# Patient Record
Sex: Male | Born: 1940 | Race: White | Hispanic: No | Marital: Single | Smoking: Never smoker
Health system: Southern US, Community
[De-identification: ages and names within clinical notes are randomized; demographics above are authoritative.]

---

## 2004-07-07 ENCOUNTER — Ambulatory Visit: Payer: Self-pay

## 2007-03-06 ENCOUNTER — Emergency Department: Payer: Self-pay | Admitting: Internal Medicine

## 2007-03-06 ENCOUNTER — Other Ambulatory Visit: Payer: Self-pay

## 2007-03-18 ENCOUNTER — Ambulatory Visit: Payer: Self-pay | Admitting: Family Medicine

## 2007-10-15 ENCOUNTER — Ambulatory Visit: Payer: Self-pay | Admitting: Family Medicine

## 2008-04-01 ENCOUNTER — Ambulatory Visit: Payer: Self-pay

## 2008-06-28 ENCOUNTER — Ambulatory Visit: Payer: Self-pay | Admitting: Pain Medicine

## 2008-07-27 ENCOUNTER — Ambulatory Visit: Payer: Self-pay | Admitting: Pain Medicine

## 2008-08-12 ENCOUNTER — Ambulatory Visit: Payer: Self-pay | Admitting: Physician Assistant

## 2008-11-19 ENCOUNTER — Emergency Department: Payer: Self-pay | Admitting: Internal Medicine

## 2009-08-21 ENCOUNTER — Inpatient Hospital Stay: Payer: Self-pay | Admitting: Internal Medicine

## 2009-11-03 ENCOUNTER — Ambulatory Visit: Payer: Self-pay | Admitting: Emergency Medicine

## 2009-11-07 LAB — PATHOLOGY REPORT

## 2010-04-06 ENCOUNTER — Emergency Department: Payer: Self-pay | Admitting: Emergency Medicine

## 2010-07-02 ENCOUNTER — Emergency Department: Payer: Self-pay | Admitting: Emergency Medicine

## 2010-11-27 ENCOUNTER — Emergency Department (HOSPITAL_COMMUNITY)
Admission: EM | Admit: 2010-11-27 | Discharge: 2010-11-27 | Discharge status: Against Medical Advice | Payer: No Typology Code available for payment source

## 2010-11-27 NOTE — ED Notes (Signed)
Pt. Also has a rash on his both of his legs and back

## 2011-04-14 ENCOUNTER — Emergency Department: Payer: Self-pay | Admitting: Emergency Medicine

## 2011-04-14 LAB — URINALYSIS, COMPLETE
Bilirubin,UR: NEGATIVE
Blood: NEGATIVE
Glucose,UR: NEGATIVE mg/dL (ref 0–75)
Hyaline Cast: 4
Protein: NEGATIVE
RBC,UR: 1 /HPF (ref 0–5)
Specific Gravity: 1.012 (ref 1.003–1.030)
Squamous Epithelial: <1

## 2011-04-14 LAB — TROPONIN I: Troponin-I: 0.02 ng/mL

## 2011-04-14 LAB — CBC
HGB: 12 g/dL — ABNORMAL LOW (ref 13.0–18.0)
MCV: 97 fL (ref 80–100)
WBC: 3.8 10*3/uL (ref 3.8–10.6)

## 2011-04-14 LAB — COMPREHENSIVE METABOLIC PANEL
Albumin: 3 g/dL — ABNORMAL LOW (ref 3.4–5.0)
Alkaline Phosphatase: 69 U/L (ref 50–136)
BUN: 9 mg/dL (ref 7–18)
Bilirubin,Total: 0.9 mg/dL (ref 0.2–1.0)
EGFR (African American): >60
Glucose: 88 mg/dL (ref 65–99)
Potassium: 2.7 mmol/L — ABNORMAL LOW (ref 3.5–5.1)
SGOT(AST): 58 U/L — ABNORMAL HIGH (ref 15–37)
SGPT (ALT): 20 U/L
Sodium: 140 mmol/L (ref 136–145)

## 2011-04-14 LAB — MAGNESIUM: Magnesium: 1 mg/dL — ABNORMAL LOW

## 2011-08-20 ENCOUNTER — Emergency Department: Payer: Self-pay | Admitting: *Deleted

## 2011-08-20 LAB — ETHANOL
Ethanol %: 0.272 % — ABNORMAL HIGH (ref 0.000–0.080)
Ethanol: 272 mg/dL

## 2011-08-20 LAB — COMPREHENSIVE METABOLIC PANEL
Alkaline Phosphatase: 80 U/L (ref 50–136)
BUN: 7 mg/dL (ref 7–18)
Bilirubin,Total: 1 mg/dL (ref 0.2–1.0)
Chloride: 108 mmol/L — ABNORMAL HIGH (ref 98–107)
Co2: 20 mmol/L — ABNORMAL LOW (ref 21–32)
Creatinine: 1.14 mg/dL (ref 0.60–1.30)
EGFR (African American): >60
EGFR (Non-African Amer.): >60
Glucose: 123 mg/dL — ABNORMAL HIGH (ref 65–99)
Osmolality: 288 (ref 275–301)
Sodium: 145 mmol/L (ref 136–145)
Total Protein: 6.4 g/dL (ref 6.4–8.2)

## 2011-08-20 LAB — CBC
HGB: 11.4 g/dL — ABNORMAL LOW (ref 13.0–18.0)
MCH: 35.4 pg — ABNORMAL HIGH (ref 26.0–34.0)
MCV: 103 fL — ABNORMAL HIGH (ref 80–100)
RBC: 3.21 10*6/uL — ABNORMAL LOW (ref 4.40–5.90)
WBC: 5.7 10*3/uL (ref 3.8–10.6)

## 2011-08-20 LAB — TSH: Thyroid Stimulating Horm: 0.77 u[IU]/mL

## 2011-12-26 ENCOUNTER — Emergency Department: Payer: Self-pay | Admitting: Emergency Medicine

## 2011-12-26 LAB — CBC
MCH: 32.8 pg (ref 26.0–34.0)
MCHC: 33.5 g/dL (ref 32.0–36.0)
Platelet: 145 10*3/uL — ABNORMAL LOW (ref 150–440)

## 2011-12-26 LAB — COMPREHENSIVE METABOLIC PANEL
Alkaline Phosphatase: 91 U/L (ref 50–136)
Chloride: 101 mmol/L (ref 98–107)
Co2: 29 mmol/L (ref 21–32)
EGFR (African American): >60
EGFR (Non-African Amer.): >60
SGOT(AST): 75 U/L — ABNORMAL HIGH (ref 15–37)
SGPT (ALT): 22 U/L (ref 12–78)

## 2011-12-26 LAB — TROPONIN I: Troponin-I: 0.04 ng/mL

## 2011-12-26 LAB — CK TOTAL AND CKMB (NOT AT ARMC): CK, Total: 42 U/L (ref 35–232)

## 2011-12-26 LAB — LIPASE, BLOOD: Lipase: 91 U/L (ref 73–393)

## 2011-12-26 LAB — URINALYSIS, COMPLETE
Bacteria: NONE SEEN
Bilirubin,UR: NEGATIVE
Glucose,UR: NEGATIVE mg/dL (ref 0–75)
Specific Gravity: 1.02 (ref 1.003–1.030)
WBC UR: 4 /HPF (ref 0–5)

## 2012-12-29 ENCOUNTER — Observation Stay: Payer: Self-pay | Admitting: Specialist

## 2012-12-29 LAB — URINALYSIS, COMPLETE
Bacteria: NONE SEEN
Blood: NEGATIVE

## 2012-12-29 LAB — CBC
HCT: 43.6 % (ref 40.0–52.0)
HGB: 15 g/dL (ref 13.0–18.0)
RDW: 13.6 % (ref 11.5–14.5)

## 2012-12-29 LAB — DRUG SCREEN, URINE
Barbiturates, Ur Screen: NEGATIVE (ref ?–200)
Benzodiazepine, Ur Scrn: NEGATIVE (ref ?–200)
Cannabinoid 50 Ng, Ur ~~LOC~~: NEGATIVE (ref ?–50)
MDMA (Ecstasy)Ur Screen: NEGATIVE (ref ?–500)
Methadone, Ur Screen: NEGATIVE (ref ?–300)
Phencyclidine (PCP) Ur S: NEGATIVE (ref ?–25)

## 2012-12-29 LAB — LIPASE, BLOOD: Lipase: 51 U/L — ABNORMAL LOW (ref 73–393)

## 2012-12-29 LAB — COMPREHENSIVE METABOLIC PANEL
Albumin: 3 g/dL — ABNORMAL LOW (ref 3.4–5.0)
BUN: 16 mg/dL (ref 7–18)
Bilirubin,Total: 0.8 mg/dL (ref 0.2–1.0)
Creatinine: 1.2 mg/dL (ref 0.60–1.30)
EGFR (Non-African Amer.): 60 — ABNORMAL LOW
Osmolality: 275 (ref 275–301)
Potassium: 4.3 mmol/L (ref 3.5–5.1)
SGPT (ALT): 36 U/L (ref 12–78)
Sodium: 136 mmol/L (ref 136–145)

## 2012-12-29 LAB — ETHANOL: Ethanol %: <0.003 % (ref 0.000–0.080)

## 2012-12-29 LAB — PROTIME-INR: INR: 0.9

## 2013-02-20 ENCOUNTER — Emergency Department: Payer: Self-pay | Admitting: Emergency Medicine

## 2013-02-20 LAB — URINALYSIS, COMPLETE
BILIRUBIN, UR: NEGATIVE
Bacteria: NONE SEEN
Blood: NEGATIVE
Glucose,UR: NEGATIVE mg/dL (ref 0–75)
Ketone: NEGATIVE
Leukocyte Esterase: NEGATIVE
NITRITE: NEGATIVE
Ph: 5 (ref 4.5–8.0)
RBC,UR: <1 /HPF (ref 0–5)
SPECIFIC GRAVITY: 1.018 (ref 1.003–1.030)
Squamous Epithelial: <1
WBC UR: 3 /HPF (ref 0–5)

## 2014-04-30 NOTE — Consult Note (Signed)
PATIENT NAME:  Jesus Hardy, Chamberlain MR#:  [id number] DATE OF BIRTH:  Jan 24, 1940  DATE OF CONSULTATION:  12/29/2012  CONSULTING PHYSICIAN:  Marcina MillardAlexander Yates Weisgerber, MD  CHIEF COMPLAINT: Abdominal pain.   REASON FOR CONSULTATION: Consultation requested for evaluation of borderline elevated troponin.   HISTORY OF PRESENT ILLNESS: The patient is a 74 year old gentleman who is admitted at this time with lower abdominal pain. The patient reports a 3-day history of recurrent episodes of lower abdominal discomfort. He denies chest pain, shortness of breath. He presented to [facility] Emergency Room where the patient had borderline elevated troponin of 0.10. Follow-up troponin was 0.09. EKG is nondiagnostic. The patient complains of a 2-to-3-day history of lower abdominal pain, intermittent nausea with vomiting. The patient has had a history of gastritis and hiatal hernia as well as colonic polyps. Abdominal CT revealed diverticulosis.   PAST MEDICAL HISTORY:  1.  Hypertension.  2.  Type 2 diabetes.  3.  Diverticulosis.  4.  Smokeless tobacco.   MEDICATIONS: None.   SOCIAL HISTORY: The patient chews tobacco, drinks twice a week, and lives by himself.   FAMILY HISTORY: No immediate family history for coronary artery disease or myocardial infarction.   REVIEW OF SYSTEMS: CONSTITUTIONAL: No fever or chills.  EYES: No blurry vision.  EARS: No hearing loss.  RESPIRATORY: No shortness of breath.  CARDIOVASCULAR: No chest pain.  GASTROINTESTINAL: Lower abdominal pain with nausea and vomiting as described above.  GENITOURINARY: No dysuria or hematuria.  ENDOCRINE: No polyuria or polydipsia.  MUSCULOSKELETAL: No arthralgias or myalgias.  NEUROLOGICAL: No focal muscle weakness or numbness.  PSYCHOLOGICAL: No depression or anxiety.   PHYSICAL EXAMINATION:  VITAL SIGNS: Blood pressure 186/87, pulse 88, respirations 18, temperature 98.3, pulse ox 99%.  HEENT: Pupils equal, reactive to light and accommodation.   NECK: Supple without thyromegaly.  LUNGS: Clear.  HEART: Normal JVP. Normal PMI. Regular rate and rhythm. Normal S1, S2. No appreciable gallop, murmur, or rub.  ABDOMEN: Soft and nontender. Pulses were intact bilaterally.  MUSCULOSKELETAL: Normal muscle tone.  NEUROLOGIC: Alert and oriented x3. Motor and sensory both grossly intact.   IMPRESSION: A 74 year old gentleman who presents with lower abdominal pain, nausea, and vomiting which sound noncardiac in nature.   The patient has borderline elevated troponin in the absence of chest pain or ECG changes. Blood pressure elevated.   RECOMMENDATIONS:  1.  Agree with overall current therapy.  2.  Will start low-dose beta blocker in light of elevated blood pressure.  3.  Defer full-dose anticoagulation.  4.  Agree with Lexiscan sestamibi study in the a.m.  5.  Further recommendations pending Lexiscan sestamibi results.    ____________________________ Marcina MillardAlexander Sultana Tierney, MD ap:np D: 12/29/2012 16:55:30 ET T: 12/29/2012 19:15:08 ET JOB#: [id number]  cc: Marcina MillardAlexander Jessiah Wojnar, MD, <Dictator> Marcina MillardALEXANDER Claretta Kendra MD ELECTRONICALLY SIGNED 01/07/2013 8:39

## 2014-04-30 NOTE — Discharge Summary (Signed)
PATIENT NAME:  Jesus Hardy, Jesus Hardy MR#:  [id number] DATE OF BIRTH:  Aug 03, 1940  DATE OF ADMISSION:  12/29/2012 DATE OF DISCHARGE:  12/30/2012  For a detailed note, please see the history and physical on admission with Dr. Elpidio AnisSudini.   DIAGNOSES AT DISCHARGE: 1.  Chest pain, likely related malignant hypertension.  2.  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  3.  Tobacco abuse.   DIET: The patient is being discharged on a low-sodium, low-fat diet.   ACTIVITY: As tolerated.  FOLLOWUP:  At the [facility] in the next 1 to 2 weeks.  DISCHARGE MEDICATIONS: Aspirin 81 mg daily, metoprolol succinate 50 mg daily, HCTZ 25 mg daily.   PERTINENT STUDIES DONE DURING THE HOSPITAL COURSE:  Are as follows: A CT scan of the abdomen and pelvis done with contrast on admission showing sigmoid diverticulosis without inflammation. No acute abnormality noted in the abdomen and pelvis. A nuclear medicine myocardial stress test done showing no significant wall motion abnormality, EF of 46%. No EKG changes concerning for ischemia.   HOSPITAL COURSE: This is a 74 year old male with past medical history of hypertension, hyperlipidemia, medical noncompliance, tobacco abuse, who presented to the hospital with chest pain/abdominal pain.  PROBLEM:  1.  Chest pain. The patient does have significant risk factors for coronary artery disease given his hypertension, tobacco abuse and medical noncompliance. He also presented with a troponin as high as 0.1. He was, therefore, observed overnight on telemetry, had his troponins checked serially. They did trend down to as low as 0.06. Cardiology consult was obtained. The patient was seen by Dr. Darrold JunkerParaschos who ordered a stress test on the patient the morning after admission. The patient underwent a myocardial scan the day after admission which showed no evidence of any acute EKG changes or myocardial ischemia. He is currently chest pain-free and hemodynamically stable  and, therefore, is being discharged home.  2.  Malignant hypertension. The patient presented to the hospital with systolic blood pressures over [id number]. He has a history of hypertension, but currently he is not taking any medications. He was started on some IV hydralazine as needed and also started on some oral hydrochlorothiazide and Toprol. His blood pressures have significantly improved since yesterday and at present he is being discharged on the Toprol and hydrochlorothiazide.  3.  Tobacco abuse. The patient was strongly advised to quit smoking. He was maintained on a nicotine patch while in the hospital.   CODE STATUS: The patient is a FULL CODE.  TIME SPENT ON DISCHARGE: 40 minutes   ____________________________ Rolly PancakeVivek J. Cherlynn KaiserSainani, MD vjs:ce D: 12/30/2012 15:58:03 ET T: 12/30/2012 16:21:00 ET JOB#: [id number]  cc: Rolly PancakeVivek J. Cherlynn KaiserSainani, MD, <Dictator> [facility] [place] SirenVIVEK J Antwane Grose MD ELECTRONICALLY SIGNED 01/01/2013 13:48

## 2014-04-30 NOTE — H&P (Signed)
PATIENT NAME:  Jesus Hardy, KLEMP MR#:  [id number] DATE OF BIRTH:  May 18, 1940  DATE OF ADMISSION:  12/29/2012  PRIMARY CARE PHYSICIAN: Toy Cookey, MD  CHIEF COMPLAINT: Abdominal pain.   HISTORY OF PRESENTING ILLNESS: A 74 year old male patient with history of chronic on and off abdominal pain, nausea and vomiting presents to the hospital complaining of worsening abdominal pain in the epigastric/periumbilical area. The patient has been noticed to have troponin of 0.10 in the Emergency Room and he is being admitted to the hospitalist service for elevated troponin, possible angina equivalent. The patient does not complain of any chest pain, shortness of breath, palpitations, lightheadedness. He mentions that he did see Dr. Maryellen Pile, his primary care physician, who refused to give him pain medications. He states he does not have any medical problems, although reviewing his old records he does have hypertension, diabetes mellitus type II, and hyperlipidemia.   He does not have any diarrhea, constipation. He has had on and off abdominal pain for over a year. Had an EGD done in 2011 which showed gastritis and hiatal hernia. Also had a colonoscopy at that time, which showed a 10 mm polyp in the sigmoid colon along with diverticulosis.   Today in the Emergency Room he had a CT scan of the abdomen done which showed diverticulosis, nothing acute.   PAST MEDICAL HISTORY: 1.  Hypertension.  2.  Diabetes mellitus type 2.  3.  Chronic low back pain.  4.  Chronic on and off abdominal pain.  5.  Noncompliance.  6.  Tobacco abuse.   PAST SURGICAL HISTORY: None.   HOME MEDICATIONS: None.   ALLERGIES: No known drug allergies.   FAMILY HISTORY: Says his father and mother passed away when he was 29 years old. He is not aware of any medical problems in the family.   SOCIAL HISTORY: The patient chews tobacco. Drinks about 2 drinks twice a week. No illicit drug use. 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   REVIEW OF SYSTEMS: CONSTITUTIONAL: No fatigue, weakness, weight loss, weight gain.  EYES: No blurred vision, pain, or redness.  ENT: No tinnitus, ear pain, or hearing loss.  RESPIRATORY: No cough, wheeze, or shortness of breath.  CARDIOVASCULAR: No chest pain, shortness of breath, or edema.  GASTROINTESTINAL: Has abdominal pain, nausea and vomiting.  GENITOURINARY: No dysuria, hematuria, or frequency.  ENDOCRINE: No polyuria, nocturia, or thyroid problems.  HEMATOLOGIC AND LYMPHATIC: No anemia, easy bruising, or bleeding.  INTEGUMENTARY: No acne, rash, or lesion.  MUSCULOSKELETAL: Has chronic back pain.  NEUROLOGIC: No focal numbness, weakness, or seizures.  PSYCHIATRIC: No anxiety or depression.   PHYSICAL EXAMINATION: VITAL SIGNS: Temperature 98.2, pulse 84, blood pressure 220/119, and saturating 100% on room air.  GENERAL: Moderately built, [place] male patient lying in bed, seems comfortable, conversational, cooperative with exam.  PSYCHIATRIC: Alert and oriented x3. Mood and affect appropriate. Judgment intact.  HEENT: Atraumatic, normocephalic. Oral mucosa dry and pink. External ears and nose normal. No pallor. No icterus. Pupils bilaterally equal and reactive to light.  NECK: Supple. No thyromegaly or palpable lymph nodes. Trachea midline. No carotid bruit or JVD.  CARDIOVASCULAR: S1 and S2 without any murmurs. Peripheral pulses 2+.  RESPIRATORY: Normal work of breathing. Clear to auscultation and percussion.  GASTROINTESTINAL: Soft abdomen, nontender. Bowel sounds present. No hepatosplenomegaly palpable.  SKIN: Warm and dry. No petechiae, rash, or ulcers.  MUSCULOSKELETAL: No joint swelling, redness, or effusion of the large joints. Normal muscle tone.  NEUROLOGICAL: Motor strength 5/5 in upper and lower extremities. Sensation to fine touch intact all over.  LYMPHATIC: No cervical lymphadenopathy.   LABORATORY AND DIAGNOSTICS: Lab studies show glucose of  138, BUN 16, creatinine 1.2, sodium 136, potassium 4.3. Troponin 0.10. AST, ALT, alkaline phosphatase, and bilirubin normal. WBC 5.5, hemoglobin 15, and platelets 155. Urinalysis shows no bacteria. Lactic acid 1.9.   CT scan of the abdomen shows diverticulosis, nothing acute.   EKG shows normal sinus rhythm, nothing acute. Occasional PVCs.   ASSESSMENT AND PLAN:  781. A 74 year old patient with accelerated hypertension and diabetes, noncompliant with medications, presents to the hospital complaining of abdominal pain, but he has been found to have elevated troponin of 0.10. This could be angina equivalent, but the patient has had elevated troponin in the past, up to 0.06. Also his accelerated hypertension could be causing demand ischemia. Will control his blood pressures, schedule him for a stress test, and check 2 more sets of cardiac enzymes. If the cardiac enzymes do increase significantly, will start him on Lovenox and schedule him for cardiac catheterization. We will consult Dr. Lady GaryFath of cardiology who has seen him in the past. The patient will be started on aspirin. For his high blood pressure cannot give him ACE inhibitors at this time secondary to the contrast load.  Diabetes the patient will benefit from metformin, but will start after 48 hours of the IV contrast. SSI. 2.  Tobacco abuse. I have counseled the patient to quit tobacco for greater than 3 minutes considering he has hypertension and diabetes and is high risk for stroke and myocardial infarction.  3.  Noncompliance. I have counseled the patient to be compliant with his medications. We will try to pick the patient's medications from 4 dollar list at discharge.  4.  Deep vein thrombosis prophylaxis with Lovenox.   CODE STATUS: FULL code.   TIME SPENT TODAY ON THIS CASE: 60 minutes. ____________________________ Molinda BailiffSrikar R. Amedio Bowlby, MD srs:sb D: 12/29/2012 15:36:57 ET T: 12/29/2012 16:01:13 ET JOB#: [id number]  cc: Wardell HeathSrikar R. Hettie Roselli, MD,  <Dictator> Serita ShellerErnest B. Maryellen PileEason, MD Orie FishermanSRIKAR R Aldrich Lloyd MD ELECTRONICALLY SIGNED 12/29/2012 17:16

## 2014-06-01 ENCOUNTER — Other Ambulatory Visit: Payer: Self-pay | Admitting: Internal Medicine

## 2014-06-01 DIAGNOSIS — M545 Low back pain, unspecified: Secondary | ICD-10-CM

## 2014-06-01 DIAGNOSIS — G8929 Other chronic pain: Secondary | ICD-10-CM

## 2014-06-08 ENCOUNTER — Ambulatory Visit: Payer: Self-pay

## 2014-06-11 ENCOUNTER — Ambulatory Visit
Admission: RE | Admit: 2014-06-11 | Discharge: 2014-06-11 | Disposition: A | Discharge status: Home / Self Care | Payer: Medicare Other | Source: Ambulatory Visit | Attending: Internal Medicine | Admitting: Internal Medicine

## 2014-06-11 DIAGNOSIS — G8929 Other chronic pain: Secondary | ICD-10-CM

## 2014-06-11 DIAGNOSIS — M47896 Other spondylosis, lumbar region: Secondary | ICD-10-CM | POA: Diagnosis not present

## 2014-06-11 DIAGNOSIS — M5386 Other specified dorsopathies, lumbar region: Secondary | ICD-10-CM | POA: Insufficient documentation

## 2014-06-11 DIAGNOSIS — M545 Low back pain: Secondary | ICD-10-CM

## 2014-06-11 DIAGNOSIS — R634 Abnormal weight loss: Secondary | ICD-10-CM | POA: Diagnosis present

## 2014-06-11 MED ORDER — GADOBENATE DIMEGLUMINE 529 MG/ML IV SOLN
15.0000 mL | Freq: Once | INTRAVENOUS | Status: AC | PRN
  Administered 2014-06-11: 14 mL via INTRAVENOUS

## 2014-08-18 ENCOUNTER — Encounter: Payer: Self-pay | Admitting: Medical Oncology

## 2014-08-18 ENCOUNTER — Emergency Department
Admission: EM | Admit: 2014-08-18 | Discharge: 2014-08-18 | Disposition: A | Discharge status: Home / Self Care | Payer: Medicare Other | Attending: Emergency Medicine | Admitting: Emergency Medicine

## 2014-08-18 ENCOUNTER — Emergency Department: Payer: Medicare Other

## 2014-08-18 DIAGNOSIS — Z79899 Other long term (current) drug therapy: Secondary | ICD-10-CM | POA: Diagnosis not present

## 2014-08-18 DIAGNOSIS — E119 Type 2 diabetes mellitus without complications: Secondary | ICD-10-CM | POA: Insufficient documentation

## 2014-08-18 DIAGNOSIS — I10 Essential (primary) hypertension: Secondary | ICD-10-CM | POA: Diagnosis not present

## 2014-08-18 DIAGNOSIS — W208XXA Other cause of strike by thrown, projected or falling object, initial encounter: Secondary | ICD-10-CM | POA: Diagnosis not present

## 2014-08-18 DIAGNOSIS — S3992XA Unspecified injury of lower back, initial encounter: Secondary | ICD-10-CM | POA: Diagnosis present

## 2014-08-18 DIAGNOSIS — Y9289 Other specified places as the place of occurrence of the external cause: Secondary | ICD-10-CM | POA: Diagnosis not present

## 2014-08-18 DIAGNOSIS — Y9389 Activity, other specified: Secondary | ICD-10-CM | POA: Diagnosis not present

## 2014-08-18 DIAGNOSIS — S32010A Wedge compression fracture of first lumbar vertebra, initial encounter for closed fracture: Secondary | ICD-10-CM | POA: Diagnosis not present

## 2014-08-18 DIAGNOSIS — Y998 Other external cause status: Secondary | ICD-10-CM | POA: Insufficient documentation

## 2014-08-18 MED ORDER — METHOCARBAMOL 750 MG PO TABS: 1500.0000 mg | ORAL_TABLET | Freq: Four times a day (QID) | ORAL | Status: AC

## 2014-08-18 MED ORDER — KETOROLAC TROMETHAMINE 30 MG/ML IJ SOLN
30.0000 mg | Freq: Once | INTRAMUSCULAR | Status: AC
  Administered 2014-08-18: 30 mg via INTRAMUSCULAR
  Filled 2014-08-18: qty 1

## 2014-08-18 MED ORDER — ORPHENADRINE CITRATE 30 MG/ML IJ SOLN
60.0000 mg | Freq: Two times a day (BID) | INTRAMUSCULAR | Status: DC
  Administered 2014-08-18: 60 mg via INTRAMUSCULAR
  Filled 2014-08-18: qty 2

## 2014-08-18 MED ORDER — HYDROMORPHONE HCL 1 MG/ML IJ SOLN
0.5000 mg | Freq: Once | INTRAMUSCULAR | Status: DC
  Filled 2014-08-18: qty 1

## 2014-08-18 MED ORDER — HYDROMORPHONE HCL 1 MG/ML IJ SOLN
1.0000 mg | Freq: Once | INTRAMUSCULAR | Status: AC
  Administered 2014-08-18: 1 mg via INTRAMUSCULAR

## 2014-08-18 NOTE — ED Notes (Signed)
Pt ambulatory to triage with reports that he was doing something with his lawn mower when he injured his back 2 days ago. Seen pcp and was placed on celebrex and hydrocodone. Pt reports pain has worsened in his lower back.

## 2014-08-18 NOTE — ED Provider Notes (Signed)
[facility] Emergency Department Provider Note  ____________________________________________  Time seen: Approximately 11:17 AM  I have reviewed the triage vital signs and the nursing notes.   HISTORY  Chief Complaint Back Pain    HPI Jesus Hardy is a 74 y.o. male patient complaining of acute low back pain secondary to have a lot more fall on top of him. Patient said he was seen by his PCP yesterday and placed on Celebrex and hydrocodone. Patient reports back pain has worsened. Patient denies any radicular component to this pain. He denies any bladder or bowel dysfunction. Patient is rating his pain as a 10 over 10.   Past Medical History  Diagnosis Date  . Diabetes mellitus     There are no active problems to display for this patient.   History reviewed. No pertinent past surgical history.  Current Outpatient Rx  Name  Route  Sig  Dispense  Refill  . amLODipine (NORVASC) 10 MG tablet   Oral   Take 10 mg by mouth daily.           . hydrochlorothiazide (HYDRODIURIL) 50 MG tablet   Oral   Take 50 mg by mouth daily.           Marland Kitchen lisinopril (PRINIVIL,ZESTRIL) 40 MG tablet   Oral   Take 40 mg by mouth 2 (two) times daily.           . metFORMIN (GLUCOPHAGE) 500 MG tablet   Oral   Take 500 mg by mouth 2 (two) times daily with a meal.           . methocarbamol (ROBAXIN-750) 750 MG tablet   Oral   Take 2 tablets (1,500 mg total) by mouth 4 (four) times daily.   40 tablet   0     Allergies Review of patient's allergies indicates no known allergies.  No family history on file.  Social History Social History  Substance Use Topics  . Smoking status: Never Smoker   . Smokeless tobacco: Current User    Types: Chew  . Alcohol Use: 0.6 oz/week    1 Cans of beer per week    Review of Systems Constitutional: No fever/chills Eyes: No visual changes. ENT: No sore throat. Cardiovascular: Denies chest pain. Respiratory: Denies  shortness of breath. Gastrointestinal: No abdominal pain.  No nausea, no vomiting.  No diarrhea.  No constipation. Genitourinary: Negative for dysuria. Musculoskeletal: Negative for back pain. Skin: Negative for rash. Neurological: Negative for headaches, focal weakness or numbness. Endocrine: Diabetes and hypertension. 10-point ROS otherwise negative.  ____________________________________________   PHYSICAL EXAM:  VITAL SIGNS: ED Triage Vitals  Enc Vitals Group     BP 08/18/14 0938 147/83 mmHg     Pulse Rate 08/18/14 0938 85     Resp 08/18/14 0938 16     Temp 08/18/14 0938 98 F (36.7 C)     Temp Source 08/18/14 0938 Oral     SpO2 08/18/14 0938 99 %     Weight 08/18/14 0938 130 lb (58.968 kg)     Height 08/18/14 0938  (1.88 m)     Head Cir --      Peak Flow --      Pain Score 08/18/14 0939 10     Pain Loc --      Pain Edu? --      Excl. in GC? --     Constitutional: Alert and oriented. Well appearing and in no acute distress. Eyes: Conjunctivae  are normal. PERRL. EOMI. Head: Atraumatic. Nose: No congestion/rhinnorhea. Mouth/Throat: Mucous membranes are moist.  Oropharynx non-erythematous. Neck: No stridor.  No cervical spine tenderness to palpation. Hematological/Lymphatic/Immunilogical: No cervical lymphadenopathy. Cardiovascular: Normal rate, regular rhythm. Grossly normal heart sounds.  Good peripheral circulation. Respiratory: Normal respiratory effort.  No retractions. Lungs CTAB. Gastrointestinal: Soft and nontender. No distention. No abdominal bruits. No CVA tenderness. Musculoskeletal: No spinal deformity Neurologic:  Normal speech and language. No gross focal neurologic deficits are appreciated. No gait instability. Skin:  Skin is warm, dry and intact. No rash noted. Psychiatric: Mood and affect are normal. Speech and behavior are normal.  ____________________________________________   LABS (all labs ordered are listed, but only abnormal results are  displayed)  Labs Reviewed - No data to display ____________________________________________  EKG   ____________________________________________  RADIOLOGY  X-rays consistent with a superior endplate compression fracture L1  I, Joni Reining, personally viewed and evaluated these images as part of my medical decision making.   ____________________________________________   PROCEDURES  Procedure(s) performed: None  Critical Care performed: No  ____________________________________________   INITIAL IMPRESSION / ASSESSMENT AND PLAN / ED COURSE  Pertinent labs & imaging results that were available during my care of the patient were reviewed by me and considered in my medical decision making (see chart for details).  Compression fracture and plate L1. This patient continued to pain medication and anti-inflammatory as directed. Will add a muscle relaxer a Robaxin. Patient advised to follow orthopedics clinic as directed. Return to ER if condition worsens. ____________________________________________   FINAL CLINICAL IMPRESSION(S) / ED DIAGNOSES  Final diagnoses:  Compression fracture of L1 lumbar vertebra, closed, initial encounter      Joni Reining, PA-C 08/18/14 1135  Governor Rooks, MD 08/18/14 [phone]

## 2014-08-18 NOTE — Discharge Instructions (Signed)
Continue previous mediations and follow up with Ortho doctor.  Call for appointment.

## 2014-08-18 NOTE — ED Notes (Signed)
Had lawn mower fall on him.  Having back pain . Has been seen by his PMD but pain is worse.

## 2017-01-11 ENCOUNTER — Emergency Department: Payer: Medicare Other

## 2017-01-11 ENCOUNTER — Inpatient Hospital Stay
Admission: EM | Admit: 2017-01-11 | Discharge: 2017-01-17 | DRG: 308 | Disposition: A | Discharge status: Skilled Nursing Facility | Payer: Medicare Other | Attending: Internal Medicine | Admitting: Internal Medicine

## 2017-01-11 ENCOUNTER — Other Ambulatory Visit: Payer: Self-pay

## 2017-01-11 ENCOUNTER — Encounter: Payer: Self-pay | Admitting: Emergency Medicine

## 2017-01-11 DIAGNOSIS — R451 Restlessness and agitation: Secondary | ICD-10-CM | POA: Diagnosis not present

## 2017-01-11 DIAGNOSIS — E43 Unspecified severe protein-calorie malnutrition: Secondary | ICD-10-CM

## 2017-01-11 DIAGNOSIS — D696 Thrombocytopenia, unspecified: Secondary | ICD-10-CM | POA: Diagnosis present

## 2017-01-11 DIAGNOSIS — G8929 Other chronic pain: Secondary | ICD-10-CM | POA: Diagnosis present

## 2017-01-11 DIAGNOSIS — F10239 Alcohol dependence with withdrawal, unspecified: Secondary | ICD-10-CM | POA: Diagnosis not present

## 2017-01-11 DIAGNOSIS — K529 Noninfective gastroenteritis and colitis, unspecified: Secondary | ICD-10-CM | POA: Diagnosis present

## 2017-01-11 DIAGNOSIS — I4891 Unspecified atrial fibrillation: Principal | ICD-10-CM | POA: Diagnosis present

## 2017-01-11 DIAGNOSIS — E875 Hyperkalemia: Secondary | ICD-10-CM | POA: Diagnosis present

## 2017-01-11 DIAGNOSIS — Z720 Tobacco use: Secondary | ICD-10-CM

## 2017-01-11 DIAGNOSIS — E119 Type 2 diabetes mellitus without complications: Secondary | ICD-10-CM | POA: Diagnosis present

## 2017-01-11 DIAGNOSIS — Z681 Body mass index (BMI) 19 or less, adult: Secondary | ICD-10-CM

## 2017-01-11 DIAGNOSIS — E872 Acidosis, unspecified: Secondary | ICD-10-CM

## 2017-01-11 DIAGNOSIS — I7102 Dissection of abdominal aorta: Secondary | ICD-10-CM

## 2017-01-11 DIAGNOSIS — E876 Hypokalemia: Secondary | ICD-10-CM | POA: Diagnosis present

## 2017-01-11 DIAGNOSIS — R55 Syncope and collapse: Secondary | ICD-10-CM

## 2017-01-11 DIAGNOSIS — Z79899 Other long term (current) drug therapy: Secondary | ICD-10-CM

## 2017-01-11 LAB — CBC
HCT: 34 % — ABNORMAL LOW (ref 40.0–52.0)
Hemoglobin: 11.5 g/dL — ABNORMAL LOW (ref 13.0–18.0)
MCH: 34.6 pg — ABNORMAL HIGH (ref 26.0–34.0)
MCHC: 33.9 g/dL (ref 32.0–36.0)
MCV: 102.1 fL — ABNORMAL HIGH (ref 80.0–100.0)
PLATELETS: 85 10*3/uL — AB (ref 150–440)
RBC: 3.33 MIL/uL — ABNORMAL LOW (ref 4.40–5.90)
RDW: 17.9 % — AB (ref 11.5–14.5)
WBC: 3.5 10*3/uL — ABNORMAL LOW (ref 3.8–10.6)

## 2017-01-11 LAB — TROPONIN I: Troponin I: 0.04 ng/mL (ref <0.03)

## 2017-01-11 LAB — URINALYSIS, COMPLETE (UACMP) WITH MICROSCOPIC
BILIRUBIN URINE: NEGATIVE
Bacteria, UA: NONE SEEN
GLUCOSE, UA: 50 mg/dL — AB
HGB URINE DIPSTICK: NEGATIVE
Ketones, ur: NEGATIVE mg/dL
Leukocytes, UA: NEGATIVE
NITRITE: NEGATIVE
PH: 8 (ref 5.0–8.0)
Protein, ur: NEGATIVE mg/dL
RBC / HPF: NONE SEEN RBC/hpf (ref 0–5)
SPECIFIC GRAVITY, URINE: 1.004 — AB (ref 1.005–1.030)

## 2017-01-11 LAB — BASIC METABOLIC PANEL
ANION GAP: 21 — AB (ref 5–15)
BUN: 8 mg/dL (ref 6–20)
CALCIUM: 7.8 mg/dL — AB (ref 8.9–10.3)
CO2: 24 mmol/L (ref 22–32)
CREATININE: 0.92 mg/dL (ref 0.61–1.24)
Chloride: 91 mmol/L — ABNORMAL LOW (ref 101–111)
GFR calc Af Amer: >60 mL/min (ref >60)
GLUCOSE: 152 mg/dL — AB (ref 65–99)
Potassium: 2.7 mmol/L — CL (ref 3.5–5.1)
Sodium: 136 mmol/L (ref 135–145)

## 2017-01-11 LAB — ETHANOL: Alcohol, Ethyl (B): <10 mg/dL (ref <10)

## 2017-01-11 LAB — HEPATIC FUNCTION PANEL
ALBUMIN: 2.7 g/dL — AB (ref 3.5–5.0)
ALT: 25 U/L (ref 17–63)
AST: 97 U/L — ABNORMAL HIGH (ref 15–41)
Alkaline Phosphatase: 99 U/L (ref 38–126)
BILIRUBIN DIRECT: 0.4 mg/dL (ref 0.1–0.5)
BILIRUBIN TOTAL: 1.4 mg/dL — AB (ref 0.3–1.2)
Indirect Bilirubin: 1 mg/dL — ABNORMAL HIGH (ref 0.3–0.9)
Total Protein: 5.8 g/dL — ABNORMAL LOW (ref 6.5–8.1)

## 2017-01-11 LAB — LACTIC ACID, PLASMA: Lactic Acid, Venous: 5.1 mmol/L (ref 0.5–1.9)

## 2017-01-11 MED ORDER — DILTIAZEM LOAD VIA INFUSION
15.0000 mg | Freq: Once | INTRAVENOUS | Status: AC
  Administered 2017-01-11: 15 mg via INTRAVENOUS

## 2017-01-11 MED ORDER — DILTIAZEM HCL 100 MG IV SOLR
INTRAVENOUS | Status: AC
  Administered 2017-01-11: 5 mg/h via INTRAVENOUS
  Filled 2017-01-11: qty 100

## 2017-01-11 MED ORDER — VANCOMYCIN HCL IN DEXTROSE 1-5 GM/200ML-% IV SOLN
1000.0000 mg | Freq: Once | INTRAVENOUS | Status: AC
  Administered 2017-01-11: 1000 mg via INTRAVENOUS
  Filled 2017-01-11: qty 200

## 2017-01-11 MED ORDER — SODIUM CHLORIDE 0.9 % IV BOLUS (SEPSIS)
1000.0000 mL | Freq: Once | INTRAVENOUS | Status: AC
  Administered 2017-01-11: 1000 mL via INTRAVENOUS

## 2017-01-11 MED ORDER — MAGNESIUM SULFATE 2 GM/50ML IV SOLN
2.0000 g | Freq: Once | INTRAVENOUS | Status: AC
  Administered 2017-01-11: 2 g via INTRAVENOUS
  Filled 2017-01-11: qty 50

## 2017-01-11 MED ORDER — DILTIAZEM HCL 60 MG PO TABS
60.0000 mg | ORAL_TABLET | Freq: Once | ORAL | Status: AC
  Administered 2017-01-11: 60 mg via ORAL
  Filled 2017-01-11: qty 1

## 2017-01-11 MED ORDER — SODIUM CHLORIDE 0.9 % IV SOLN
Freq: Once | INTRAVENOUS | Status: AC
  Administered 2017-01-11: via INTRAVENOUS

## 2017-01-11 MED ORDER — PIPERACILLIN-TAZOBACTAM 3.375 G IVPB 30 MIN
3.3750 g | Freq: Once | INTRAVENOUS | Status: AC
  Administered 2017-01-11: 3.375 g via INTRAVENOUS
  Filled 2017-01-11: qty 50

## 2017-01-11 MED ORDER — SODIUM CHLORIDE 0.9 % IV BOLUS (SEPSIS): 1000.0000 mL | Freq: Once | INTRAVENOUS | Status: DC

## 2017-01-11 MED ORDER — PIPERACILLIN-TAZOBACTAM 3.375 G IVPB
3.3750 g | Freq: Three times a day (TID) | INTRAVENOUS | Status: DC
  Administered 2017-01-12 – 2017-01-13 (×4): 3.375 g via INTRAVENOUS
  Filled 2017-01-11 (×4): qty 50

## 2017-01-11 MED ORDER — VANCOMYCIN HCL IN DEXTROSE 750-5 MG/150ML-% IV SOLN
750.0000 mg | Freq: Two times a day (BID) | INTRAVENOUS | Status: DC
  Administered 2017-01-12: 750 mg via INTRAVENOUS
  Filled 2017-01-11 (×2): qty 150

## 2017-01-11 MED ORDER — DILTIAZEM HCL 100 MG IV SOLR
5.0000 mg/h | INTRAVENOUS | Status: DC
  Administered 2017-01-11: 5 mg/h via INTRAVENOUS
  Administered 2017-01-12 (×2): 15 mg/h via INTRAVENOUS
  Filled 2017-01-11 (×4): qty 100

## 2017-01-11 MED ORDER — IOPAMIDOL (ISOVUE-370) INJECTION 76%
100.0000 mL | Freq: Once | INTRAVENOUS | Status: AC | PRN
  Administered 2017-01-11: 100 mL via INTRAVENOUS

## 2017-01-11 NOTE — ED Notes (Signed)
Called pharmacy to request diltiazem tablet.

## 2017-01-11 NOTE — Progress Notes (Signed)
CODE SEPSIS - PHARMACY COMMUNICATION  **Broad Spectrum Antibiotics should be administered within 1 hour of Sepsis diagnosis**  Time Code Sepsis Called/Page Received: 2324  Antibiotics Ordered: zosyn  Time of 1st antibiotic administration: 2147  Additional action taken by pharmacy: none  If necessary, Name of Provider/Nurse Contacted: none    Thomasene Rippleavid  Jaquise Faux ,PharmD Clinical Pharmacist  01/11/2017  11:27 PM

## 2017-01-11 NOTE — ED Notes (Signed)
Patient transported to CT 

## 2017-01-11 NOTE — ED Provider Notes (Signed)
[facility] Emergency Department Provider Note    First MD Initiated Contact with Patient 01/11/17 1826     (approximate)  I have reviewed the triage vital signs and the nursing notes.   HISTORY  Chief Complaint Loss of Consciousness    HPI Jesus Hardy is a 77 y.o. male with a history of diabetes but no recent hospitalizations presents after syncopal episode while patient was at [employer].  Patient states he was feeling palpitations also having right flank pain is mild to moderate and constant without radiation.  Does not recall hitting his head but did have brief loss of consciousness.  EMS was called and found that his glucose is greater than 100 patient was found to be in A. fib with RVR.  He denies any palpitations or chest pain.  States last time he had a drink of alcohol was yesterday.  Denies any nausea or vomiting.  No fevers.  States he lives at home alone.  Denies any numbness or tingling or blurry vision.  Past Medical History:  Diagnosis Date  . Diabetes mellitus    No family history on file. History reviewed. No pertinent surgical history. There are no active problems to display for this patient.     Prior to Admission medications   Medication Sig Start Date End Date Taking? Authorizing Provider  methocarbamol (ROBAXIN-750) 750 MG tablet Take 2 tablets (1,500 mg total) by mouth 4 (four) times daily. Patient not taking: Reported on 01/11/2017 08/18/14   Joni Reining, PA-C    Allergies Patient has no known allergies.    Social History Social History   Tobacco Use  . Smoking status: Never Smoker  . Smokeless tobacco: Current User    Types: Chew  Substance Use Topics  . Alcohol use: Yes    Alcohol/week: 0.6 oz    Types: 1 Cans of beer per week  . Drug use: No    Review of Systems Patient denies headaches, rhinorrhea, blurry vision, numbness, shortness of breath, chest pain, edema, cough, abdominal pain, nausea,  vomiting, diarrhea, dysuria, fevers, rashes or hallucinations unless otherwise stated above in HPI. ____________________________________________   PHYSICAL EXAM:  VITAL SIGNS: Vitals:   01/11/17 2213 01/11/17 2245  BP:    Pulse: 62 (!) 51  Resp: 19 16  Temp:    SpO2: 99% 97%    Constitutional: Alert and oriented. Frail appearing but in no respiratory distress Eyes: Conjunctivae are normal.  Head: Atraumatic. Nose: No congestion/rhinnorhea. Mouth/Throat: Mucous membranes are moist.   Neck: No stridor. Painless ROM.  Cardiovascular: irregularly irregular rhythm. Grossly normal heart sounds.  Good peripheral circulation. Respiratory: Normal respiratory effort.  No retractions. Lungs CTAB. Gastrointestinal: Soft and nontender. No distention. No abdominal bruits. No CVA tenderness. Genitourinary:  Musculoskeletal: No lower extremity tenderness nor edema.  No joint effusions. Neurologic:  Normal speech and language. No gross focal neurologic deficits are appreciated. No facial droop Skin:  Skin is warm, dry and intact. No rash noted. Psychiatric: Mood and affect are normal. Speech and behavior are normal.  ____________________________________________   LABS (all labs ordered are listed, but only abnormal results are displayed)  Results for orders placed or performed during the hospital encounter of 01/11/17 (from the past 24 hour(s))  Basic metabolic panel     Status: Abnormal   Collection Time: 01/11/17  6:20 PM  Result Value Ref Range   Sodium 136 135 - 145 mmol/L   Potassium 2.7 (LL) 3.5 - 5.1 mmol/L  Chloride 91 (L) 101 - 111 mmol/L   CO2 24 22 - 32 mmol/L   Glucose, Bld 152 (H) 65 - 99 mg/dL   BUN 8 6 - 20 mg/dL   Creatinine, Ser 1.610.92 0.61 - 1.24 mg/dL   Calcium 7.8 (L) 8.9 - 10.3 mg/dL   GFR calc non Af Amer >60 >60 mL/min   GFR calc Af Amer >60 >60 mL/min   Anion gap 21 (H) 5 - 15  CBC     Status: Abnormal   Collection Time: 01/11/17  6:20 PM  Result Value Ref  Range   WBC 3.5 (L) 3.8 - 10.6 K/uL   RBC 3.33 (L) 4.40 - 5.90 MIL/uL   Hemoglobin 11.5 (L) 13.0 - 18.0 g/dL   HCT 09.634.0 (L) 04.540.0 - 40.952.0 %   MCV 102.1 (H) 80.0 - 100.0 fL   MCH 34.6 (H) 26.0 - 34.0 pg   MCHC 33.9 32.0 - 36.0 g/dL   RDW 81.117.9 (H) 91.411.5 - 78.214.5 %   Platelets 85 (L) 150 - 440 K/uL  Troponin I     Status: Abnormal   Collection Time: 01/11/17  6:20 PM  Result Value Ref Range   Troponin I 0.04 (HH) <0.03 ng/mL  Ethanol     Status: None   Collection Time: 01/11/17  8:46 PM  Result Value Ref Range   Alcohol, Ethyl (B) <10 <10 mg/dL  Hepatic function panel     Status: Abnormal   Collection Time: 01/11/17  8:46 PM  Result Value Ref Range   Total Protein 5.8 (L) 6.5 - 8.1 g/dL   Albumin 2.7 (L) 3.5 - 5.0 g/dL   AST 97 (H) 15 - 41 U/L   ALT 25 17 - 63 U/L   Alkaline Phosphatase 99 38 - 126 U/L   Total Bilirubin 1.4 (H) 0.3 - 1.2 mg/dL   Bilirubin, Direct 0.4 0.1 - 0.5 mg/dL   Indirect Bilirubin 1.0 (H) 0.3 - 0.9 mg/dL  Lactic acid, plasma     Status: Abnormal   Collection Time: 01/11/17  8:46 PM  Result Value Ref Range   Lactic Acid, Venous 5.1 (HH) 0.5 - 1.9 mmol/L  Urinalysis, Complete w Microscopic     Status: Abnormal   Collection Time: 01/11/17  9:52 PM  Result Value Ref Range   Color, Urine STRAW (A) YELLOW   APPearance CLEAR (A) CLEAR   Specific Gravity, Urine 1.004 (L) 1.005 - 1.030   pH 8.0 5.0 - 8.0   Glucose, UA 50 (A) NEGATIVE mg/dL   Hgb urine dipstick NEGATIVE NEGATIVE   Bilirubin Urine NEGATIVE NEGATIVE   Ketones, ur NEGATIVE NEGATIVE mg/dL   Protein, ur NEGATIVE NEGATIVE mg/dL   Nitrite NEGATIVE NEGATIVE   Leukocytes, UA NEGATIVE NEGATIVE   RBC / HPF NONE SEEN 0 - 5 RBC/hpf   WBC, UA 0-5 0 - 5 WBC/hpf   Bacteria, UA NONE SEEN NONE SEEN   Squamous Epithelial / LPF 0-5 (A) NONE SEEN   ____________________________________________  EKG My review and personal interpretation at Time: 18:24   Indication: tachycardia  Rate: 150  Rhythm: afib with RVR  Axis: normal Other: nonspecific st and t wave changes, likely rate dependent ____________________________________________  RADIOLOGY  I personally reviewed all radiographic images ordered to evaluate for the above acute complaints and reviewed radiology reports and findings.  These findings were personally discussed with the patient.  Please see medical record for radiology report.  ____________________________________________   PROCEDURES  Procedure(s) performed:  .Critical Care Performed  by: Willy Eddy, MD Authorized by: Willy Eddy, MD   Critical care provider statement:    Critical care time (minutes):  45   Critical care time was exclusive of:  Separately billable procedures and treating other patients   Critical care was time spent personally by me on the following activities:  Development of treatment plan with patient or surrogate, discussions with consultants, evaluation of patient's response to treatment, examination of patient, obtaining history from patient or surrogate, ordering and performing treatments and interventions, ordering and review of laboratory studies, ordering and review of radiographic studies, pulse oximetry, re-evaluation of patient's condition and review of old charts      Critical Care performed: yes____________________________________________   INITIAL IMPRESSION / ASSESSMENT AND PLAN / ED COURSE  Pertinent labs & imaging results that were available during my care of the patient were reviewed by me and considered in my medical decision making (see chart for details).  DDX: Dysrhythmia, dehydration, sepsis, pneumonia, urethritis, colitis, AAA, dissection, perforation, SBO  EINAR NOLASCO is a 77 y.o. who presents to the ED with syncopal event as described above.  CT imaging ordered to evaluate for evidence of head injury or AAA shows no acute intracranial abnormality but does show evidence of possible ascending colitis.  Patient also  noted to be in A. fib with RVR but is normotensive.  We will give IV fluids as well as IV and oral Cardizem.  Clinical Course as of Jan 12 2332  Caleen Essex Jan 11, 2017  2042 His heart rate is improving with IV fluids as well as Cardizem drip.  [PR]  2152 Lactate elevated at 5.1.  Will order CT angiogram to evaluate for evidence of mesenteric ischemia.  Heart rate is improving.  We will continue with IV fluids.  [PR]  2329 CT imaging reviewed with patient.  With evidence of small infrarenal dissection.  Blood pressure has improved with Cardizem drip and is currently rate controlled.  IV antibiotics infusing due to concern for sepsis though patient without any fever or source.  Spoke with Dr. Gillermina Hu of vascular surgery who is recommended medical management with strict blood pressure control.  No heparinization at this time.  Patient will be admitted to the hospital for further medical management.  Remains Hemodynamically stable at this time.   [PR]    Clinical Course User Index [PR] Willy Eddy, MD     ____________________________________________   FINAL CLINICAL IMPRESSION(S) / ED DIAGNOSES  Final diagnoses:  Atrial fibrillation with RVR (HCC)  Lactic acidosis  Syncope and collapse  Dissection of abdominal aorta (HCC)      NEW MEDICATIONS STARTED DURING THIS VISIT:  This SmartLink is deprecated. Use AVSMEDLIST instead to display the medication list for a patient.   Note:  This document was prepared using Dragon voice recognition software and may include unintentional dictation errors.    Willy Eddy, MD 01/11/17 [id number]

## 2017-01-11 NOTE — Progress Notes (Addendum)
Pharmacy Antibiotic Note  Jesus GrinderWilliam N Hardy is a 77 y.o. male admitted on 01/11/2017 with intra-abdominal infection (colitis or cystitis per CT).  Pharmacy has been consulted for zosyn dosing.  Plan: Patient received vanc 1g and zosyn 3.375g IV x 1 in ED Will continue w/ vanc 750 mg IV q12h w/ 8 hour stack dose. Will draw a vanc trough 01/06 @ 1900 prior to 4th dose Zosyn 3.375g IV q8h (4 hour infusion).   Ke 0.0509  T1/2 13 ~ 12 hrs Goal trough 15 - 20 mcg/mL   Height: 6\' 2"  (188 cm) Weight: 130 lb (59 kg) IBW/kg (Calculated) : 82.2  Temp (24hrs), Avg:97.7 F (36.5 C), Min:97.5 F (36.4 C), Max:97.8 F (36.6 C)  Recent Labs  Lab 01/11/17 1820 01/11/17 2046  WBC 3.5*  --   CREATININE 0.92  --   LATICACIDVEN  --  5.1*    Estimated Creatinine Clearance: 56.1 mL/min (by C-G formula based on SCr of 0.92 mg/dL).    No Known Allergies  Thank you for allowing pharmacy to be a part of this patient's care.  Thomasene Rippleavid Addysen Louth, PharmD, BCPS Clinical Pharmacist  01/11/2017

## 2017-01-11 NOTE — ED Triage Notes (Signed)
[facility]  EMS reports patient with Afib with RVR.  Rate varies from 110-200.  #20 LAC by EMS

## 2017-01-11 NOTE — ED Notes (Signed)
Patient's heart rate increased to 175. MD informed. RN to bedside. Patient up, standing next to the bed trying. Patient placed back in bed. Patient's heart rate decreased to 105. MD informed. RN will continue to monitor.

## 2017-01-11 NOTE — ED Notes (Addendum)
Pt taken to CT.

## 2017-01-12 ENCOUNTER — Inpatient Hospital Stay
Admit: 2017-01-12 | Discharge: 2017-01-12 | Disposition: A | Discharge status: Home / Self Care | Payer: Medicare Other | Attending: Internal Medicine | Admitting: Internal Medicine

## 2017-01-12 ENCOUNTER — Other Ambulatory Visit: Payer: Self-pay

## 2017-01-12 DIAGNOSIS — Z681 Body mass index (BMI) 19 or less, adult: Secondary | ICD-10-CM | POA: Diagnosis not present

## 2017-01-12 DIAGNOSIS — I4891 Unspecified atrial fibrillation: Secondary | ICD-10-CM | POA: Diagnosis present

## 2017-01-12 DIAGNOSIS — K529 Noninfective gastroenteritis and colitis, unspecified: Secondary | ICD-10-CM | POA: Diagnosis present

## 2017-01-12 DIAGNOSIS — G8929 Other chronic pain: Secondary | ICD-10-CM | POA: Diagnosis present

## 2017-01-12 DIAGNOSIS — D696 Thrombocytopenia, unspecified: Secondary | ICD-10-CM | POA: Diagnosis present

## 2017-01-12 DIAGNOSIS — Z720 Tobacco use: Secondary | ICD-10-CM | POA: Diagnosis not present

## 2017-01-12 DIAGNOSIS — I7102 Dissection of abdominal aorta: Secondary | ICD-10-CM | POA: Diagnosis present

## 2017-01-12 DIAGNOSIS — E872 Acidosis: Secondary | ICD-10-CM | POA: Diagnosis present

## 2017-01-12 DIAGNOSIS — Z79899 Other long term (current) drug therapy: Secondary | ICD-10-CM | POA: Diagnosis not present

## 2017-01-12 DIAGNOSIS — E43 Unspecified severe protein-calorie malnutrition: Secondary | ICD-10-CM | POA: Diagnosis present

## 2017-01-12 DIAGNOSIS — F10239 Alcohol dependence with withdrawal, unspecified: Secondary | ICD-10-CM | POA: Diagnosis not present

## 2017-01-12 DIAGNOSIS — R451 Restlessness and agitation: Secondary | ICD-10-CM | POA: Diagnosis not present

## 2017-01-12 DIAGNOSIS — R55 Syncope and collapse: Secondary | ICD-10-CM | POA: Diagnosis present

## 2017-01-12 DIAGNOSIS — E875 Hyperkalemia: Secondary | ICD-10-CM | POA: Diagnosis present

## 2017-01-12 DIAGNOSIS — E876 Hypokalemia: Secondary | ICD-10-CM | POA: Diagnosis present

## 2017-01-12 DIAGNOSIS — E119 Type 2 diabetes mellitus without complications: Secondary | ICD-10-CM | POA: Diagnosis present

## 2017-01-12 LAB — BASIC METABOLIC PANEL
ANION GAP: 11 (ref 5–15)
BUN: 6 mg/dL (ref 6–20)
CHLORIDE: 94 mmol/L — AB (ref 101–111)
CO2: 31 mmol/L (ref 22–32)
CREATININE: 0.85 mg/dL (ref 0.61–1.24)
Calcium: 7.6 mg/dL — ABNORMAL LOW (ref 8.9–10.3)
GFR calc non Af Amer: >60 mL/min (ref >60)
Glucose, Bld: 144 mg/dL — ABNORMAL HIGH (ref 65–99)
POTASSIUM: 2.7 mmol/L — AB (ref 3.5–5.1)
SODIUM: 136 mmol/L (ref 135–145)

## 2017-01-12 LAB — CBC
HEMATOCRIT: 31.9 % — AB (ref 40.0–52.0)
HEMOGLOBIN: 10.9 g/dL — AB (ref 13.0–18.0)
MCH: 34.7 pg — AB (ref 26.0–34.0)
MCHC: 34.3 g/dL (ref 32.0–36.0)
MCV: 101.2 fL — AB (ref 80.0–100.0)
PLATELETS: 68 10*3/uL — AB (ref 150–440)
RBC: 3.15 MIL/uL — AB (ref 4.40–5.90)
RDW: 18 % — ABNORMAL HIGH (ref 11.5–14.5)
WBC: 3.4 10*3/uL — AB (ref 3.8–10.6)

## 2017-01-12 LAB — MAGNESIUM: MAGNESIUM: 1.2 mg/dL — AB (ref 1.7–2.4)

## 2017-01-12 LAB — GLUCOSE, CAPILLARY: Glucose-Capillary: 138 mg/dL — ABNORMAL HIGH (ref 65–99)

## 2017-01-12 LAB — LACTIC ACID, PLASMA: LACTIC ACID, VENOUS: 2.9 mmol/L — AB (ref 0.5–1.9)

## 2017-01-12 MED ORDER — ACETAMINOPHEN 325 MG PO TABS: 650.0000 mg | ORAL_TABLET | Freq: Four times a day (QID) | ORAL | Status: DC | PRN

## 2017-01-12 MED ORDER — HEPARIN SODIUM (PORCINE) 5000 UNIT/ML IJ SOLN
5000.0000 [IU] | Freq: Three times a day (TID) | INTRAMUSCULAR | Status: DC
  Administered 2017-01-12 (×2): 5000 [IU] via SUBCUTANEOUS
  Filled 2017-01-12 (×2): qty 1

## 2017-01-12 MED ORDER — HYDROCODONE-ACETAMINOPHEN 5-325 MG PO TABS
1.0000 | ORAL_TABLET | ORAL | Status: DC | PRN
  Administered 2017-01-12 – 2017-01-14 (×2): 1 via ORAL
  Filled 2017-01-12 (×2): qty 1

## 2017-01-12 MED ORDER — METHOCARBAMOL 750 MG PO TABS
1500.0000 mg | ORAL_TABLET | Freq: Four times a day (QID) | ORAL | Status: DC
  Administered 2017-01-12 – 2017-01-17 (×17): 1500 mg via ORAL
  Filled 2017-01-12 (×26): qty 2

## 2017-01-12 MED ORDER — DOCUSATE SODIUM 100 MG PO CAPS
100.0000 mg | ORAL_CAPSULE | Freq: Two times a day (BID) | ORAL | Status: DC
  Administered 2017-01-12 – 2017-01-17 (×11): 100 mg via ORAL
  Filled 2017-01-12 (×12): qty 1

## 2017-01-12 MED ORDER — CALCIUM CARBONATE ANTACID 500 MG PO CHEW
1.0000 | CHEWABLE_TABLET | Freq: Four times a day (QID) | ORAL | Status: DC | PRN
  Administered 2017-01-13: 200 mg via ORAL
  Filled 2017-01-12: qty 1

## 2017-01-12 MED ORDER — VANCOMYCIN HCL 500 MG IV SOLR
500.0000 mg | Freq: Two times a day (BID) | INTRAVENOUS | Status: DC
  Administered 2017-01-12 – 2017-01-13 (×2): 500 mg via INTRAVENOUS
  Filled 2017-01-12 (×3): qty 500

## 2017-01-12 MED ORDER — MAGNESIUM SULFATE 2 GM/50ML IV SOLN
2.0000 g | Freq: Once | INTRAVENOUS | Status: AC
  Administered 2017-01-12: 2 g via INTRAVENOUS
  Filled 2017-01-12: qty 50

## 2017-01-12 MED ORDER — TRAZODONE HCL 50 MG PO TABS: 25.0000 mg | ORAL_TABLET | Freq: Every evening | ORAL | Status: DC | PRN

## 2017-01-12 MED ORDER — POTASSIUM CHLORIDE CRYS ER 20 MEQ PO TBCR
40.0000 meq | EXTENDED_RELEASE_TABLET | Freq: Two times a day (BID) | ORAL | Status: DC
  Administered 2017-01-12 – 2017-01-14 (×6): 40 meq via ORAL
  Filled 2017-01-12 (×6): qty 2

## 2017-01-12 MED ORDER — ONDANSETRON HCL 4 MG/2ML IJ SOLN: 4.0000 mg | Freq: Four times a day (QID) | INTRAMUSCULAR | Status: DC | PRN

## 2017-01-12 MED ORDER — ONDANSETRON HCL 4 MG PO TABS: 4.0000 mg | ORAL_TABLET | Freq: Four times a day (QID) | ORAL | Status: DC | PRN

## 2017-01-12 MED ORDER — DILTIAZEM HCL 30 MG PO TABS
60.0000 mg | ORAL_TABLET | Freq: Four times a day (QID) | ORAL | Status: DC
  Administered 2017-01-12 – 2017-01-13 (×4): 60 mg via ORAL
  Filled 2017-01-12 (×4): qty 2

## 2017-01-12 MED ORDER — ACETAMINOPHEN 650 MG RE SUPP: 650.0000 mg | Freq: Four times a day (QID) | RECTAL | Status: DC | PRN

## 2017-01-12 MED ORDER — BISACODYL 5 MG PO TBEC: 5.0000 mg | DELAYED_RELEASE_TABLET | Freq: Every day | ORAL | Status: DC | PRN

## 2017-01-12 NOTE — Progress Notes (Signed)
[employer] [facility] [place] [facility]   PATIENT NAME: Jesus Hardy    MR#:  [id number]  DATE OF BIRTH:  Jul 26, 1940  SUBJECTIVE:  CHIEF COMPLAINT:   Chief Complaint  Patient presents with  . Loss of Consciousness     Came with abd pain and syncopal episode, noted to have a small aortic aneurysm, Finding of colitis on CT abd. Also new onset A fib with RVR.  REVIEW OF SYSTEMS:  CONSTITUTIONAL: No fever, fatigue or weakness.  EYES: No blurred or double vision.  EARS, NOSE, AND THROAT: No tinnitus or ear pain.  RESPIRATORY: No cough, shortness of breath, wheezing or hemoptysis.  CARDIOVASCULAR: No chest pain, orthopnea, edema.  GASTROINTESTINAL: No nausea, vomiting, diarrhea or abdominal pain.  GENITOURINARY: No dysuria, hematuria.  ENDOCRINE: No polyuria, nocturia,  HEMATOLOGY: No anemia, easy bruising or bleeding SKIN: No rash or lesion. MUSCULOSKELETAL: No joint pain or arthritis.   NEUROLOGIC: No tingling, numbness, weakness.  PSYCHIATRY: No anxiety or depression.   ROS  DRUG ALLERGIES:  No Known Allergies  VITALS:  Blood pressure 112/62, pulse (!) 50, temperature 98.2 F (36.8 C), temperature source Oral, resp. rate (!) 21, height 6\' 3"  (1.905 m), weight 54.5 kg (120 lb 1.6 oz), SpO2 99 %.  PHYSICAL EXAMINATION:  GENERAL:  77 y.o.-year-old patient lying in the bed with no acute distress.  EYES: Pupils equal, round, reactive to light and accommodation. No scleral icterus. Extraocular muscles intact.  HEENT: Head atraumatic, normocephalic. Oropharynx and nasopharynx clear.  NECK:  Supple, no jugular venous distention. No thyroid enlargement, no tenderness.  LUNGS: Normal breath sounds bilaterally, no wheezing, rales,rhonchi or crepitation. No use of accessory muscles of respiration.  CARDIOVASCULAR: S1, S2 normal. No murmurs, rubs, or gallops.  ABDOMEN: Soft, nontender, nondistended. Bowel sounds present. No organomegaly or mass.  EXTREMITIES: No pedal  edema, cyanosis, or clubbing.  NEUROLOGIC: Cranial nerves II through XII are intact. Muscle strength 5/5 in all extremities. Sensation intact. Gait not checked.  PSYCHIATRIC: The patient is alert and oriented x 3.  SKIN: No obvious rash, lesion, or ulcer.   Physical Exam LABORATORY PANEL:   CBC Recent Labs  Lab 01/12/17 0721  WBC 3.4*  HGB 10.9*  HCT 31.9*  PLT 68*   ------------------------------------------------------------------------------------------------------------------  Chemistries  Recent Labs  Lab 01/11/17 2046 01/12/17 0721  NA  --  136  K  --  2.7*  CL  --  94*  CO2  --  31  GLUCOSE  --  144*  BUN  --  6  CREATININE  --  0.85  CALCIUM  --  7.6*  MG  --  1.2*  AST 97*  --   ALT 25  --   ALKPHOS 99  --   BILITOT 1.4*  --    ------------------------------------------------------------------------------------------------------------------  Cardiac Enzymes Recent Labs  Lab 01/11/17 1820  TROPONINI 0.04*   ------------------------------------------------------------------------------------------------------------------  RADIOLOGY:  Ct Head Wo Contrast  Result Date: 01/11/2017 CLINICAL DATA:  Syncope EXAM: CT HEAD WITHOUT CONTRAST TECHNIQUE: Contiguous axial images were obtained from the base of the skull through the vertex without intravenous contrast. COMPARISON:  08/20/2011 FINDINGS: Brain: There is no evidence for acute hemorrhage, hydrocephalus, mass lesion, or abnormal extra-axial fluid collection. No definite CT evidence for acute infarction. Diffuse loss of parenchymal volume is consistent with atrophy. Patchy low attenuation in the deep hemispheric and periventricular white matter is nonspecific, but likely reflects chronic microvascular ischemic demyelination. Vascular: No hyperdense vessel or unexpected calcification. Skull: No evidence  for fracture. No worrisome lytic or sclerotic lesion. Sinuses/Orbits: Chronic mucosal disease noted in the  maxillary sinuses, right more ethmoid air cells, and right frontal sinus. Visualized portions of the globes and intraorbital fat are unremarkable. Other: None. IMPRESSION: 1. No acute intracranial abnormality. 2. Atrophy with chronic small vessel white matter ischemic disease. 3. Chronic paranasal sinus disease. Electronically Signed   By: Kennith CenterEric  Mansell M.D.   On: 01/11/2017 19:41   Dg Chest Portable 1 View  Result Date: 01/11/2017 CLINICAL DATA:  Syncope EXAM: PORTABLE CHEST 1 VIEW COMPARISON:  12/26/2011 FINDINGS: 1817 hours. Lungs are hyperexpanded. The lungs are clear without focal pneumonia, edema, pneumothorax or pleural effusion. The cardiopericardial silhouette is within normal limits for size. The visualized bony structures of the thorax are intact. Telemetry leads overlie the chest. IMPRESSION: Hyperexpansion without acute findings. Electronically Signed   By: Kennith CenterEric  Mansell M.D.   On: 01/11/2017 18:54   Ct Renal Stone Study  Result Date: 01/11/2017 CLINICAL DATA:  77 y/o  M; flank pain, stone disease suspected. EXAM: CT ABDOMEN AND PELVIS WITHOUT CONTRAST TECHNIQUE: Multidetector CT imaging of the abdomen and pelvis was performed following the standard protocol without IV contrast. COMPARISON:  12/29/2012 CT abdomen and pelvis. FINDINGS: Lower chest: No acute abnormality. Hepatobiliary: Subcentimeter in segment 4B and segment 6 cysts. Stable right lobe of liver calcified granuloma. Hepatic steatosis. Cholelithiasis. No biliary ductal dilatation. Pancreas: Unremarkable. No pancreatic ductal dilatation or surrounding inflammatory changes. Spleen: Normal in size without focal abnormality. Adrenals/Urinary Tract: Normal adrenal glands. Left kidney interpolar subcentimeter hemorrhagic cyst. No other focal kidney lesion identified. No urinary stone disease. No hydronephrosis. Normal bladder. Stomach/Bowel: Normal appearance of stomach and small bowel. Mild diffuse wall thickening of the colon greatest the  ascending segment cecum compatible with colitis. Extensive sigmoid diverticulosis. Vascular/Lymphatic: Aortic atherosclerosis. No enlarged abdominal or pelvic lymph nodes. Reproductive: Central prostatic calcifications. Other:  No ascites. Musculoskeletal: Interval mild L1 and L2 anterior compression deformities. Persistent fracture lines are present, likely recent injury. Moderate multilevel degenerative changes of the spine. IMPRESSION: 1. Interval mild L1 and L2 anterior compression deformities, likely recent. 2. Mild diffuse wall thickening of the colon greatest in the ascending segment compatible with colitis. 3. Cholelithiasis. 4. No nephrolithiasis or hydronephrosis identified. 5. Extensive sigmoid diverticulosis. 6. Aortic atherosclerosis. Electronically Signed   By: Mitzi HansenLance  Furusawa-Stratton M.D.   On: 01/11/2017 19:45   Ct Angio Abd/pel W And/or Wo Contrast  Result Date: 01/11/2017 CLINICAL DATA:  Syncope today. Atrial fibrillation. Abdominal pain. EXAM: CTA ABDOMEN AND PELVIS wITHOUT AND WITH CONTRAST TECHNIQUE: Multidetector CT imaging of the abdomen and pelvis was performed using the standard protocol during bolus administration of intravenous contrast. Multiplanar reconstructed images and MIPs were obtained and reviewed to evaluate the vascular anatomy. CONTRAST:  100mL ISOVUE-370 IOPAMIDOL (ISOVUE-370) INJECTION 76% COMPARISON:  01/11/2017 at 1911 hours. FINDINGS: VASCULAR Aorta: A focal dissecting flap is demonstrated in the infrarenal abdominal aorta. This appears to be associated with the origin of a lumbar artery. The flap does not definitively extend very far superiorly or inferiorly. An atypical dissection is not excluded. The aorta remains widely patent without aneurysm. Scattered aortic calcifications. Celiac: Patent without evidence of aneurysm, dissection, vasculitis or significant stenosis. SMA: Patent without evidence of aneurysm, dissection, vasculitis or significant stenosis. Renals:  Both renal arteries are patent without evidence of aneurysm, dissection, vasculitis, fibromuscular dysplasia or significant stenosis. IMA: Patent without evidence of aneurysm, dissection, vasculitis or significant stenosis. Inflow: Patent without evidence of aneurysm, dissection, vasculitis  or significant stenosis. Proximal Outflow: Bilateral common femoral and visualized portions of the superficial and profunda femoral arteries are patent without evidence of aneurysm, dissection, vasculitis or significant stenosis. Veins: No obvious venous abnormality within the limitations of this arterial phase study. Portal and mesenteric veins are patent. Review of the MIP images confirms the above findings. NON-VASCULAR Lower chest: Mild dependent changes in the lung bases in addition to respiratory motion artifact. Coronary artery calcifications. Small esophageal hiatal hernia. Hepatobiliary: Diffuse fatty infiltration of the liver. No focal lesions identified. Layering sludge in the gallbladder. No discrete stones or wall thickening identified. Bile ducts are not dilated. Pancreas: Unremarkable. No pancreatic ductal dilatation or surrounding inflammatory changes. Spleen: Normal in size without focal abnormality. Adrenals/Urinary Tract: No adrenal gland nodules. Kidneys are symmetrical. Nephrograms are symmetrical. Mild prominence of the renal collecting systems bilaterally but no stone or focal obstruction is demonstrated. Bladder wall is mildly thickened which may indicate cystitis. Stomach/Bowel: Stomach, small bowel, and colon are decompressed, limiting evaluation for wall thickening. There is contrast material in the colon. Diffuse diverticulosis of the descending and sigmoid colon. No evidence of diverticulitis. Appendix is normal. Lymphatic: No significant lymphadenopathy. Reproductive: Prostate is unremarkable. Other: No abdominal wall hernia or abnormality. No abdominopelvic ascites. Musculoskeletal: Degenerative  changes in the lumbar spine. Compression deformities at multiple lumbar vertebrae as demonstrated on the previous study from earlier today. Sclerosis in the L1 and L2 compression fractures may indicate recent fractures. IMPRESSION: VASCULAR A small focal dissecting flap is demonstrated in the infrarenal abdominal aorta without significant progression. An atypical dissection is not excluded. Aortic atherosclerosis. NON-VASCULAR 1. Diffuse fatty infiltration of the liver. 2. Layering sludge in the gallbladder without obvious inflammatory signs. 3. Mild prominence of renal collecting systems bilaterally is likely physiologic. No stone or focal obstruction is demonstrated. 4. Bladder wall thickening may indicate cystitis. 5. Colonic diverticulosis without evidence of diverticulitis. 6. Multiple compression deformities in the lumbar spine. L1 and L2 compression may be recent. No change since prior study from earlier today. These results were called by telephone at the time of interpretation on 01/11/2017 at 11:11 pm to Dr. Willy Eddy , who verbally acknowledged these results. Electronically Signed   By: Burman Nieves M.D.   On: 01/11/2017 23:16    ASSESSMENT AND PLAN:   Active Problems:   New onset a-fib (HCC)  1.  New onset atrial fibrillation with RVR,   on Cardizem drip.    Converted to normal sinus rhythm. Switch to oral as per recommendation by cardiology.   Echocardiogram.   As this happened during an episode of infection and patient has finding of small aortic aneurysm, he is not a candidate for long-term anticoagulation.  2.  Lactic acidosis   monitor for mesenteric ischemia.    sepsis,  due to colitis.   Given IV fluids.  3.  Acute colitis, per abd CT started on IV antibiotics.  Patient has abdominal pain but he denies nausea vomiting or diarrhea.  We will continue to monitor clinically closely.   Start on diet today as no pain.  4.  Small infrarenal aortic dissection.  This was  discussed with vascular surgery and the recommendation was for medical management, keep the blood pressure well controlled. Avoid anticoagulation.  5. Thrombocytopenia   Stopped heparin subcutaneous, continue monitoring.  * Hypokalemia, hypomagnesemia   Replace, recheck in the morning.  records are reviewed and case discussed with Care Management/Social Workerr. Management plans discussed with the patient, family and they are  in agreement.  CODE STATUS: Full code.  TOTAL TIME TAKING CARE OF THIS PATIENT35  minutes.   Discussed with cardiologist.  POSSIBLE D/C1-2DAYS, DEPENDING ON CLINICAL CONDITION.   Altamese Dilling M.D on 01/12/2017   Between 7am to 6pm - Pager - [phone]  After 6pm go to [email or web address] - [email or web address]  [facility]  Office  [phone]  CC: Primary care physician; [place], [facility]  Note: This dictation was prepared with Dragon dictation along with smaller phrase technology. Any transcriptional errors that result from this process are unintentional.

## 2017-01-12 NOTE — ED Notes (Signed)
Attempted to call report to floor. Primary RN unavailable at this time. Was informed RN would return call as soon as available.

## 2017-01-12 NOTE — ED Notes (Signed)
Called floor to attempt to give report. Informed that report could not be taken at this time

## 2017-01-12 NOTE — Progress Notes (Signed)
Pharmacy Antibiotic Note  Jesus Hardy is a 5Kathale[age] Grinder89 y.o. male admitted on 01/11/2017 with intra-abdominal infection (colitis or cystitis per CT) and sepsis.  Pharmacy has been consulted for zosyn and vancomycin dosing.  Plan: Patient received vanc 1g and zosyn 3.375g IV x 1 in ED  Will continue w/ vanc 750 mg IV q12h w/ 8 hour stack dose.  1/5: Pt with decrease in weight. Current dosing likely to result in trough >20. Will decrease dose to vancomycin 500 mg IV q12h. Renal function stable. Will draw a vanc trough 01/06 @ 1900.  Zosyn 3.375g IV q8h (4 hour infusion).   Ke 0.0509  T1/2 13 ~ 12 hrs Goal trough 15 - 20 mcg/mL   Height: 6\' 3"  (190.5 cm) Weight: 120 lb 1.6 oz (54.5 kg) IBW/kg (Calculated) : 84.5  Temp (24hrs), Avg:97.8 F (36.6 C), Min:97.5 F (36.4 C), Max:98.2 F (36.8 C)  Recent Labs  Lab 01/11/17 1820 01/11/17 2046 01/12/17 0038 01/12/17 0721  WBC 3.5*  --   --  3.4*  CREATININE 0.92  --   --  0.85  LATICACIDVEN  --  5.1* 2.9*  --     Estimated Creatinine Clearance: 56.1 mL/min (by C-G formula based on SCr of 0.85 mg/dL).    No Known Allergies   BC x2 NGTD  Thank you for allowing pharmacy to be a part of this patient's care.  Crist FatHannah Chaney Ingram, PharmD, BCPS Clinical Pharmacist 01/12/2017 1:08 PM

## 2017-01-12 NOTE — Progress Notes (Signed)
Notified Dr. Elisabeth PigeonVachhani via text page of patient's low magnesium level of 1.2, which had been added on to this morning's BMP. No new orders received. Will continue to monitor labs. Jari FavreSteven M [facility] Division[facility]hoff

## 2017-01-12 NOTE — Plan of Care (Signed)
  Clinical Measurements: Ability to maintain clinical measurements within normal limits will improve 01/12/2017 1547 - Not Progressing by Raynald BlendImhoff, Haya Hemler M, RN Note Potassium critically low today at only 2.7. PO replacement given. Will continue to monitor lab values. Jari FavreSteven M [facility]

## 2017-01-12 NOTE — H&P (Signed)
[facility] [place] at [facility]   PATIENT NAME: Jesus Hardy    MR#:  [id number]  DATE OF BIRTH:  April 03, 1940  DATE OF ADMISSION:  01/11/2017  PRIMARY CARE PHYSICIAN: [place] [place] [facility]   REQUESTING/REFERRING PHYSICIAN:   CHIEF COMPLAINT:   Chief Complaint  Patient presents with  . Loss of Consciousness    HISTORY OF PRESENT ILLNESS: Wessley Emert  is a 77 y.o. male with a known history of pedis and chronic pain.  Evette Cristal was brought to the hospital by paramedics status post syncopal episode at a [employer].  Patient recalls having palpitations and falling down to the floor, he lost consciousness for a few seconds.  Denies hurting his head or having any injuries from the fall.  He currently complains of lower abdominal pain radiating to the back.  No diarrhea, no nausea vomiting, no fever or chills.  Upon evaluation in the emergency room, lactic acid level was noted to be elevated at 5.  EKG showed atrial fibrillation with rapid ventricular response.  Abdominal CT showed small infrarenal aortic dissection and acute colitis.  Patient is admitted for further evaluation and management.  PAST MEDICAL HISTORY:   Past Medical History:  Diagnosis Date  . Diabetes mellitus     PAST SURGICAL HISTORY: History reviewed. No pertinent surgical history.  SOCIAL HISTORY:  Social History   Tobacco Use  . Smoking status: Never Smoker  . Smokeless tobacco: Current User    Types: Chew  Substance Use Topics  . Alcohol use: Yes    Alcohol/week: 0.6 oz    Types: 1 Cans of beer per week    FAMILY HISTORY: No family history on file.  DRUG ALLERGIES: No Known Allergies  REVIEW OF SYSTEMS:   CONSTITUTIONAL: No fever, fatigue or weakness.  EYES: No blurred or double vision.  EARS, NOSE, AND THROAT: No tinnitus or ear pain.  RESPIRATORY: No cough, shortness of breath, wheezing or hemoptysis.  CARDIOVASCULAR: No chest pain, orthopnea, edema.   Pt complains of palpitations.  He had a syncope with collapse. GASTROINTESTINAL: No nausea, vomiting, diarrhea, but he complains of lower abdominal pain radiating to the back.  GENITOURINARY: No dysuria, hematuria.  ENDOCRINE: No polyuria, nocturia,  HEMATOLOGY: No anemia, easy bruising or bleeding SKIN: No rash or lesion. MUSCULOSKELETAL: History of osteoarthritis.   NEUROLOGIC: No tingling, numbness, weakness.  PSYCHIATRY: No anxiety or depression.   MEDICATIONS AT HOME:  Prior to Admission medications   Medication Sig Start Date End Date Taking? Authorizing Provider  methocarbamol (ROBAXIN-750) 750 MG tablet Take 2 tablets (1,500 mg total) by mouth 4 (four) times daily. Patient not taking: Reported on 01/11/2017 08/18/14   Joni Reining, PA-C      PHYSICAL EXAMINATION:   VITAL SIGNS: Blood pressure (!) 146/77, pulse (!) 125, temperature 98.2 F (36.8 C), temperature source Oral, resp. rate (!) 21, height 6\' 3"  (1.905 m), weight 54.4 kg (120 lb), SpO2 97 %.  GENERAL:  77 y.o.-year-old patient lying in the bed, in mild distress, secondary to abdominal pain.  EYES: Pupils equal, round, reactive to light and accommodation. No scleral icterus. Extraocular muscles intact.  HEENT: Head atraumatic, normocephalic. Oropharynx and nasopharynx clear.  NECK:  Supple, no jugular venous distention. No thyroid enlargement, no tenderness.  LUNGS: Normal breath sounds bilaterally, no wheezing, rales,rhonchi or crepitation. No use of accessory muscles of respiration.  CARDIOVASCULAR: S1, S2 normal. No S3/S4.  ABDOMEN: Soft, nondistended, but tender in the mid lower abdominal  area; bowel sounds present. No organomegaly or mass.  EXTREMITIES: No pedal edema, cyanosis, or clubbing.  NEUROLOGIC: No focal weakness; gait not checked.  PSYCHIATRIC: The patient is alert and oriented x 3.  SKIN: No obvious rash, lesion, or ulcer.   LABORATORY PANEL:   CBC Recent Labs  Lab 01/11/17 1820  WBC 3.5*  HGB  11.5*  HCT 34.0*  PLT 85*  MCV 102.1*  MCH 34.6*  MCHC 33.9  RDW 17.9*   ------------------------------------------------------------------------------------------------------------------  Chemistries  Recent Labs  Lab 01/11/17 1820 01/11/17 2046  NA 136  --   K 2.7*  --   CL 91*  --   CO2 24  --   GLUCOSE 152*  --   BUN 8  --   CREATININE 0.92  --   CALCIUM 7.8*  --   AST  --  97*  ALT  --  25  ALKPHOS  --  99  BILITOT  --  1.4*   ------------------------------------------------------------------------------------------------------------------ estimated creatinine clearance is 51.7 mL/min (by C-G formula based on SCr of 0.92 mg/dL). ------------------------------------------------------------------------------------------------------------------ No results for input(s): TSH, T4TOTAL, T3FREE, THYROIDAB in the last 72 hours.  Invalid input(s): FREET3   Coagulation profile No results for input(s): INR, PROTIME in the last 168 hours. ------------------------------------------------------------------------------------------------------------------- No results for input(s): DDIMER in the last 72 hours. -------------------------------------------------------------------------------------------------------------------  Cardiac Enzymes Recent Labs  Lab 01/11/17 1820  TROPONINI 0.04*   ------------------------------------------------------------------------------------------------------------------ Invalid input(s): POCBNP  ---------------------------------------------------------------------------------------------------------------  Urinalysis    Component Value Date/Time   COLORURINE STRAW (A) 01/11/2017 2152   APPEARANCEUR CLEAR (A) 01/11/2017 2152   APPEARANCEUR Clear 02/20/2013 1616   LABSPEC 1.004 (L) 01/11/2017 2152   LABSPEC 1.018 02/20/2013 1616   PHURINE 8.0 01/11/2017 2152   GLUCOSEU 50 (A) 01/11/2017 2152   GLUCOSEU Negative 02/20/2013 1616   HGBUR  NEGATIVE 01/11/2017 2152   BILIRUBINUR NEGATIVE 01/11/2017 2152   BILIRUBINUR Negative 02/20/2013 1616   KETONESUR NEGATIVE 01/11/2017 2152   PROTEINUR NEGATIVE 01/11/2017 2152   NITRITE NEGATIVE 01/11/2017 2152   LEUKOCYTESUR NEGATIVE 01/11/2017 2152   LEUKOCYTESUR Negative 02/20/2013 1616     RADIOLOGY: Ct Head Wo Contrast  Result Date: 01/11/2017 CLINICAL DATA:  Syncope EXAM: CT HEAD WITHOUT CONTRAST TECHNIQUE: Contiguous axial images were obtained from the base of the skull through the vertex without intravenous contrast. COMPARISON:  08/20/2011 FINDINGS: Brain: There is no evidence for acute hemorrhage, hydrocephalus, mass lesion, or abnormal extra-axial fluid collection. No definite CT evidence for acute infarction. Diffuse loss of parenchymal volume is consistent with atrophy. Patchy low attenuation in the deep hemispheric and periventricular white matter is nonspecific, but likely reflects chronic microvascular ischemic demyelination. Vascular: No hyperdense vessel or unexpected calcification. Skull: No evidence for fracture. No worrisome lytic or sclerotic lesion. Sinuses/Orbits: Chronic mucosal disease noted in the maxillary sinuses, right more ethmoid air cells, and right frontal sinus. Visualized portions of the globes and intraorbital fat are unremarkable. Other: None. IMPRESSION: 1. No acute intracranial abnormality. 2. Atrophy with chronic small vessel white matter ischemic disease. 3. Chronic paranasal sinus disease. Electronically Signed   By: Kennith CenterEric  Mansell M.D.   On: 01/11/2017 19:41   Dg Chest Portable 1 View  Result Date: 01/11/2017 CLINICAL DATA:  Syncope EXAM: PORTABLE CHEST 1 VIEW COMPARISON:  12/26/2011 FINDINGS: 1817 hours. Lungs are hyperexpanded. The lungs are clear without focal pneumonia, edema, pneumothorax or pleural effusion. The cardiopericardial silhouette is within normal limits for size. The visualized bony structures of the thorax are intact. Telemetry leads  overlie  the chest. IMPRESSION: Hyperexpansion without acute findings. Electronically Signed   By: Kennith Center M.D.   On: 01/11/2017 18:54   Ct Renal Stone Study  Result Date: 01/11/2017 CLINICAL DATA:  77 y/o  M; flank pain, stone disease suspected. EXAM: CT ABDOMEN AND PELVIS WITHOUT CONTRAST TECHNIQUE: Multidetector CT imaging of the abdomen and pelvis was performed following the standard protocol without IV contrast. COMPARISON:  12/29/2012 CT abdomen and pelvis. FINDINGS: Lower chest: No acute abnormality. Hepatobiliary: Subcentimeter in segment 4B and segment 6 cysts. Stable right lobe of liver calcified granuloma. Hepatic steatosis. Cholelithiasis. No biliary ductal dilatation. Pancreas: Unremarkable. No pancreatic ductal dilatation or surrounding inflammatory changes. Spleen: Normal in size without focal abnormality. Adrenals/Urinary Tract: Normal adrenal glands. Left kidney interpolar subcentimeter hemorrhagic cyst. No other focal kidney lesion identified. No urinary stone disease. No hydronephrosis. Normal bladder. Stomach/Bowel: Normal appearance of stomach and small bowel. Mild diffuse wall thickening of the colon greatest the ascending segment cecum compatible with colitis. Extensive sigmoid diverticulosis. Vascular/Lymphatic: Aortic atherosclerosis. No enlarged abdominal or pelvic lymph nodes. Reproductive: Central prostatic calcifications. Other:  No ascites. Musculoskeletal: Interval mild L1 and L2 anterior compression deformities. Persistent fracture lines are present, likely recent injury. Moderate multilevel degenerative changes of the spine. IMPRESSION: 1. Interval mild L1 and L2 anterior compression deformities, likely recent. 2. Mild diffuse wall thickening of the colon greatest in the ascending segment compatible with colitis. 3. Cholelithiasis. 4. No nephrolithiasis or hydronephrosis identified. 5. Extensive sigmoid diverticulosis. 6. Aortic atherosclerosis. Electronically Signed   By:  Mitzi Hansen M.D.   On: 01/11/2017 19:45   Ct Angio Abd/pel W And/or Wo Contrast  Result Date: 01/11/2017 CLINICAL DATA:  Syncope today. Atrial fibrillation. Abdominal pain. EXAM: CTA ABDOMEN AND PELVIS wITHOUT AND WITH CONTRAST TECHNIQUE: Multidetector CT imaging of the abdomen and pelvis was performed using the standard protocol during bolus administration of intravenous contrast. Multiplanar reconstructed images and MIPs were obtained and reviewed to evaluate the vascular anatomy. CONTRAST:  ISOVUE-370 IOPAMIDOL (ISOVUE-370) INJECTION 76% COMPARISON:  01/11/2017 at 1911 hours. FINDINGS: VASCULAR Aorta: A focal dissecting flap is demonstrated in the infrarenal abdominal aorta. This appears to be associated with the origin of a lumbar artery. The flap does not definitively extend very far superiorly or inferiorly. An atypical dissection is not excluded. The aorta remains widely patent without aneurysm. Scattered aortic calcifications. Celiac: Patent without evidence of aneurysm, dissection, vasculitis or significant stenosis. SMA: Patent without evidence of aneurysm, dissection, vasculitis or significant stenosis. Renals: Both renal arteries are patent without evidence of aneurysm, dissection, vasculitis, fibromuscular dysplasia or significant stenosis. IMA: Patent without evidence of aneurysm, dissection, vasculitis or significant stenosis. Inflow: Patent without evidence of aneurysm, dissection, vasculitis or significant stenosis. Proximal Outflow: Bilateral common femoral and visualized portions of the superficial and profunda femoral arteries are patent without evidence of aneurysm, dissection, vasculitis or significant stenosis. Veins: No obvious venous abnormality within the limitations of this arterial phase study. Portal and mesenteric veins are patent. Review of the MIP images confirms the above findings. NON-VASCULAR Lower chest: Mild dependent changes in the lung bases in addition to  respiratory motion artifact. Coronary artery calcifications. Small esophageal hiatal hernia. Hepatobiliary: Diffuse fatty infiltration of the liver. No focal lesions identified. Layering sludge in the gallbladder. No discrete stones or wall thickening identified. Bile ducts are not dilated. Pancreas: Unremarkable. No pancreatic ductal dilatation or surrounding inflammatory changes. Spleen: Normal in size without focal abnormality. Adrenals/Urinary Tract: No adrenal gland nodules. Kidneys are symmetrical. Nephrograms are  symmetrical. Mild prominence of the renal collecting systems bilaterally but no stone or focal obstruction is demonstrated. Bladder wall is mildly thickened which may indicate cystitis. Stomach/Bowel: Stomach, small bowel, and colon are decompressed, limiting evaluation for wall thickening. There is contrast material in the colon. Diffuse diverticulosis of the descending and sigmoid colon. No evidence of diverticulitis. Appendix is normal. Lymphatic: No significant lymphadenopathy. Reproductive: Prostate is unremarkable. Other: No abdominal wall hernia or abnormality. No abdominopelvic ascites. Musculoskeletal: Degenerative changes in the lumbar spine. Compression deformities at multiple lumbar vertebrae as demonstrated on the previous study from earlier today. Sclerosis in the L1 and L2 compression fractures may indicate recent fractures. IMPRESSION: VASCULAR A small focal dissecting flap is demonstrated in the infrarenal abdominal aorta without significant progression. An atypical dissection is not excluded. Aortic atherosclerosis. NON-VASCULAR 1. Diffuse fatty infiltration of the liver. 2. Layering sludge in the gallbladder without obvious inflammatory signs. 3. Mild prominence of renal collecting systems bilaterally is likely physiologic. No stone or focal obstruction is demonstrated. 4. Bladder wall thickening may indicate cystitis. 5. Colonic diverticulosis without evidence of diverticulitis.  6. Multiple compression deformities in the lumbar spine. L1 and L2 compression may be recent. No change since prior study from earlier today. These results were called by telephone at the time of interpretation on 01/11/2017 at 11:11 pm to Dr. Willy Eddy , who verbally acknowledged these results. Electronically Signed   By: Burman Nieves M.D.   On: 01/11/2017 23:16    EKG: Orders placed or performed during the hospital encounter of 01/11/17  . ED EKG  . ED EKG  . EKG 12-Lead  . EKG 12-Lead  . EKG 12-Lead  . EKG 12-Lead    IMPRESSION AND PLAN:   1.  New onset atrial fibrillation with RVR, improving on Cardizem drip.  2.  Lactic acidosis of unclear etiology.  Will monitor for mesenteric ischemia.  We will start antibiotics for possible sepsis, although there is no fever or obvious source. 3.  Acute colitis, per abd CT started on IV antibiotics.  Patient has abdominal pain but he denies nausea vomiting or diarrhea.  We will continue to monitor clinically closely. 4.  Small infrarenal aortic dissection.  This was discussed with vascular surgery and the recommendation was for medical management, keep the blood pressure well controlled.  All the records are reviewed and case discussed with ED provider. Management plans discussed with the patient, family and they are in agreement.  CODE STATUS:    Code Status Orders  (From admission, onward)        Start     Ordered   01/12/17 0226  Full code  Continuous     01/12/17 0225    Code Status History    Date Active Date Inactive Code Status Order ID Comments User Context   This patient has a current code status but no historical code status.       TOTAL TIME TAKING CARE OF THIS PATIENT: 40 minutes.    Cammy Copa M.D on 01/12/2017 at 4:54 AM  Between 7am to 6pm - Pager - [phone]  After 6pm go to [email or web address] - password [email or web address] [facility]  [facility]  Office  [phone]  CC: Primary care physician;  [place] [place] [facility]

## 2017-01-12 NOTE — Progress Notes (Signed)
The patient is admitted to room 259 with the diagnosis of Afib. Alert and oriented x 4. Admission profile completed.  Cardizem drip running at 15 mL/hr with HR fluctuating between 108-118. Patient was offered Hydrocodone 5/325 mg  1 tab for c/o abdominal pain. Patient voiced no other concern. Will continue to monitor,

## 2017-01-12 NOTE — ED Notes (Signed)
Meal given per MD Dolores FrameSung

## 2017-01-12 NOTE — Consult Note (Signed)
[facility] Cardiology  CARDIOLOGY CONSULT NOTE  Patient ID: ANKUSH GINTZ MRN: [id number] DOB/AGE: 78-Jan-1942 78 y.o.  Admit date: 01/11/2017 Referring Physician Elisabeth Pigeon Primary Physician   [facility] Primary Care  Primary Cardiologist  Reason for Consultation atrial fibrillation  HPI: 77 year old gentleman referred for evaluation of atrial fibrillation.  The patient was brought to [facility] emergency room via EMS following a syncopal episode while at the [facility] store.  In the emergency room, patient was noted to be in atrial fibrillation with a rapid ventricular rate.  Patient also complained of lower abdominal pain which radiated to his back.  Admission labs were notable for a potassium of 2.7 and elevated lactic acid of 2.9.  Abdominal CT revealed acute colitis as well as small infrarenal aortic dissection.  Vascular surgery was consulted with recommendation of medical management and appropriate blood pressure control.  The patient was started on intravenous vancomycin.  The patient converted to sinus rhythm on Cardizem drip.  He denies chest pain or shortness of breath.  Review of systems complete and found to be negative unless listed above     Past Medical History:  Diagnosis Date  . Diabetes mellitus     History reviewed. No pertinent surgical history.  Medications Prior to Admission  Medication Sig Dispense Refill Last Dose  . methocarbamol (ROBAXIN-750) 750 MG tablet Take 2 tablets (1,500 mg total) by mouth 4 (four) times daily. (Patient not taking: Reported on 01/11/2017) 40 tablet 0 -- at --   Social History   Socioeconomic History  . Marital status: Single    Spouse name: Not on file  . Number of children: Not on file  . Years of education: Not on file  . Highest education level: Not on file  Social Needs  . Financial resource strain: Not on file  . Food insecurity - worry: Not on file  . Food insecurity - inability: Not on file  . Transportation needs - medical:  Not on file  . Transportation needs - non-medical: Not on file  Occupational History  . Not on file  Tobacco Use  . Smoking status: Never Smoker  . Smokeless tobacco: Current User    Types: Chew  Substance and Sexual Activity  . Alcohol use: Yes    Alcohol/week: 0.6 oz    Types: 1 Cans of beer per week  . Drug use: No  . Sexual activity: Yes  Other Topics Concern  . Not on file  Social History Narrative  . Not on file    No family history on file.    Review of systems complete and found to be negative unless listed above      PHYSICAL EXAM  General: Well developed, well nourished, in no acute distress HEENT:  Normocephalic and atramatic Neck:  No JVD.  Lungs: Clear bilaterally to auscultation and percussion. Heart: HRRR . Normal S1 and S2 without gallops or murmurs.  Abdomen: Bowel sounds are positive, abdomen soft and non-tender  Msk:  Back normal, normal gait. Normal strength and tone for age. Extremities: No clubbing, cyanosis or edema.   Neuro: Alert and oriented X 3. Psych:  Good affect, responds appropriately  Labs:   Lab Results  Component Value Date   WBC 3.4 (L) 01/12/2017   HGB 10.9 (L) 01/12/2017   HCT 31.9 (L) 01/12/2017   MCV 101.2 (H) 01/12/2017   PLT 68 (L) 01/12/2017    Recent Labs  Lab 01/11/17 2046 01/12/17 0721  NA  --  136  K  --  2.7*  CL  --  94*  CO2  --  31  BUN  --  6  CREATININE  --  0.85  CALCIUM  --  7.6*  PROT 5.8*  --   BILITOT 1.4*  --   ALKPHOS 99  --   ALT 25  --   AST 97*  --   GLUCOSE  --  144*   Lab Results  Component Value Date   CKTOTAL 42 12/26/2011   CKMB < 0.5 (L) 12/26/2011   TROPONINI 0.04 (HH) 01/11/2017   No results found for: CHOL No results found for: HDL No results found for: LDLCALC No results found for: TRIG No results found for: CHOLHDL No results found for: LDLDIRECT    Radiology: Ct Head Wo Contrast  Result Date: 01/11/2017 CLINICAL DATA:  Syncope EXAM: CT HEAD WITHOUT CONTRAST  TECHNIQUE: Contiguous axial images were obtained from the base of the skull through the vertex without intravenous contrast. COMPARISON:  08/20/2011 FINDINGS: Brain: There is no evidence for acute hemorrhage, hydrocephalus, mass lesion, or abnormal extra-axial fluid collection. No definite CT evidence for acute infarction. Diffuse loss of parenchymal volume is consistent with atrophy. Patchy low attenuation in the deep hemispheric and periventricular white matter is nonspecific, but likely reflects chronic microvascular ischemic demyelination. Vascular: No hyperdense vessel or unexpected calcification. Skull: No evidence for fracture. No worrisome lytic or sclerotic lesion. Sinuses/Orbits: Chronic mucosal disease noted in the maxillary sinuses, right more ethmoid air cells, and right frontal sinus. Visualized portions of the globes and intraorbital fat are unremarkable. Other: None. IMPRESSION: 1. No acute intracranial abnormality. 2. Atrophy with chronic small vessel white matter ischemic disease. 3. Chronic paranasal sinus disease. Electronically Signed   By: Kennith Center M.D.   On: 01/11/2017 19:41   Dg Chest Portable 1 View  Result Date: 01/11/2017 CLINICAL DATA:  Syncope EXAM: PORTABLE CHEST 1 VIEW COMPARISON:  12/26/2011 FINDINGS: 1817 hours. Lungs are hyperexpanded. The lungs are clear without focal pneumonia, edema, pneumothorax or pleural effusion. The cardiopericardial silhouette is within normal limits for size. The visualized bony structures of the thorax are intact. Telemetry leads overlie the chest. IMPRESSION: Hyperexpansion without acute findings. Electronically Signed   By: Kennith Center M.D.   On: 01/11/2017 18:54   Ct Renal Stone Study  Result Date: 01/11/2017 CLINICAL DATA:  77 y/o  M; flank pain, stone disease suspected. EXAM: CT ABDOMEN AND PELVIS WITHOUT CONTRAST TECHNIQUE: Multidetector CT imaging of the abdomen and pelvis was performed following the standard protocol without IV  contrast. COMPARISON:  12/29/2012 CT abdomen and pelvis. FINDINGS: Lower chest: No acute abnormality. Hepatobiliary: Subcentimeter in segment 4B and segment 6 cysts. Stable right lobe of liver calcified granuloma. Hepatic steatosis. Cholelithiasis. No biliary ductal dilatation. Pancreas: Unremarkable. No pancreatic ductal dilatation or surrounding inflammatory changes. Spleen: Normal in size without focal abnormality. Adrenals/Urinary Tract: Normal adrenal glands. Left kidney interpolar subcentimeter hemorrhagic cyst. No other focal kidney lesion identified. No urinary stone disease. No hydronephrosis. Normal bladder. Stomach/Bowel: Normal appearance of stomach and small bowel. Mild diffuse wall thickening of the colon greatest the ascending segment cecum compatible with colitis. Extensive sigmoid diverticulosis. Vascular/Lymphatic: Aortic atherosclerosis. No enlarged abdominal or pelvic lymph nodes. Reproductive: Central prostatic calcifications. Other:  No ascites. Musculoskeletal: Interval mild L1 and L2 anterior compression deformities. Persistent fracture lines are present, likely recent injury. Moderate multilevel degenerative changes of the spine. IMPRESSION: 1. Interval mild L1 and L2 anterior compression deformities, likely recent. 2. Mild diffuse wall thickening of the  colon greatest in the ascending segment compatible with colitis. 3. Cholelithiasis. 4. No nephrolithiasis or hydronephrosis identified. 5. Extensive sigmoid diverticulosis. 6. Aortic atherosclerosis. Electronically Signed   By: Mitzi HansenLance  Furusawa-Stratton M.D.   On: 01/11/2017 19:45   Ct Angio Abd/pel W And/or Wo Contrast  Result Date: 01/11/2017 CLINICAL DATA:  Syncope today. Atrial fibrillation. Abdominal pain. EXAM: CTA ABDOMEN AND PELVIS wITHOUT AND WITH CONTRAST TECHNIQUE: Multidetector CT imaging of the abdomen and pelvis was performed using the standard protocol during bolus administration of intravenous contrast. Multiplanar  reconstructed images and MIPs were obtained and reviewed to evaluate the vascular anatomy. CONTRAST:  100mL ISOVUE-370 IOPAMIDOL (ISOVUE-370) INJECTION 76% COMPARISON:  01/11/2017 at 1911 hours. FINDINGS: VASCULAR Aorta: A focal dissecting flap is demonstrated in the infrarenal abdominal aorta. This appears to be associated with the origin of a lumbar artery. The flap does not definitively extend very far superiorly or inferiorly. An atypical dissection is not excluded. The aorta remains widely patent without aneurysm. Scattered aortic calcifications. Celiac: Patent without evidence of aneurysm, dissection, vasculitis or significant stenosis. SMA: Patent without evidence of aneurysm, dissection, vasculitis or significant stenosis. Renals: Both renal arteries are patent without evidence of aneurysm, dissection, vasculitis, fibromuscular dysplasia or significant stenosis. IMA: Patent without evidence of aneurysm, dissection, vasculitis or significant stenosis. Inflow: Patent without evidence of aneurysm, dissection, vasculitis or significant stenosis. Proximal Outflow: Bilateral common femoral and visualized portions of the superficial and profunda femoral arteries are patent without evidence of aneurysm, dissection, vasculitis or significant stenosis. Veins: No obvious venous abnormality within the limitations of this arterial phase study. Portal and mesenteric veins are patent. Review of the MIP images confirms the above findings. NON-VASCULAR Lower chest: Mild dependent changes in the lung bases in addition to respiratory motion artifact. Coronary artery calcifications. Small esophageal hiatal hernia. Hepatobiliary: Diffuse fatty infiltration of the liver. No focal lesions identified. Layering sludge in the gallbladder. No discrete stones or wall thickening identified. Bile ducts are not dilated. Pancreas: Unremarkable. No pancreatic ductal dilatation or surrounding inflammatory changes. Spleen: Normal in size  without focal abnormality. Adrenals/Urinary Tract: No adrenal gland nodules. Kidneys are symmetrical. Nephrograms are symmetrical. Mild prominence of the renal collecting systems bilaterally but no stone or focal obstruction is demonstrated. Bladder wall is mildly thickened which may indicate cystitis. Stomach/Bowel: Stomach, small bowel, and colon are decompressed, limiting evaluation for wall thickening. There is contrast material in the colon. Diffuse diverticulosis of the descending and sigmoid colon. No evidence of diverticulitis. Appendix is normal. Lymphatic: No significant lymphadenopathy. Reproductive: Prostate is unremarkable. Other: No abdominal wall hernia or abnormality. No abdominopelvic ascites. Musculoskeletal: Degenerative changes in the lumbar spine. Compression deformities at multiple lumbar vertebrae as demonstrated on the previous study from earlier today. Sclerosis in the L1 and L2 compression fractures may indicate recent fractures. IMPRESSION: VASCULAR A small focal dissecting flap is demonstrated in the infrarenal abdominal aorta without significant progression. An atypical dissection is not excluded. Aortic atherosclerosis. NON-VASCULAR 1. Diffuse fatty infiltration of the liver. 2. Layering sludge in the gallbladder without obvious inflammatory signs. 3. Mild prominence of renal collecting systems bilaterally is likely physiologic. No stone or focal obstruction is demonstrated. 4. Bladder wall thickening may indicate cystitis. 5. Colonic diverticulosis without evidence of diverticulitis. 6. Multiple compression deformities in the lumbar spine. L1 and L2 compression may be recent. No change since prior study from earlier today. These results were called by telephone at the time of interpretation on 01/11/2017 at 11:11 pm to Dr. Willy EddyPATRICK ROBINSON , who  verbally acknowledged these results. Electronically Signed   By: Burman Nieves M.D.   On: 01/11/2017 23:16    EKG: Atrial fibrillation  with a rapid ventricular rate  ASSESSMENT AND PLAN:   1.  Atrial fibrillation in the setting of acute colitis, with small infrarenal aortic dissection noted on abdominal CT, converted to sinus rhythm on Cardizem drip 2.  Acute colitis 3.  Lactic acidosis 4.  Small infrarenal aortic dissection  Recommendations  1.  Agree with current therapy 2.  Start Cardizem 60 mg p.o. every 6 hours 3.  Taper Cardizem drip to off 4.  Defer started chronic anticoagulation in light of patient's infrarenal aortic dissection 5.  Review 2D echocardiogram  Signed: Marcina Millard MD,PhD, [facility] 01/12/2017, 12:04 PM

## 2017-01-13 DIAGNOSIS — E43 Unspecified severe protein-calorie malnutrition: Secondary | ICD-10-CM

## 2017-01-13 LAB — ECHOCARDIOGRAM COMPLETE
E decel time: 204 msec
FS: 18 % — AB (ref 28–44)
Height: 75 in
IVS/LV PW RATIO, ED: 1.05
LA diam end sys: 35 mm
LA diam index: 2.1 cm/m2
LA vol A4C: 43.3 ml
LA vol: 46.4 mL
LASIZE: 35 mm
LAVOLIN: 27.8 mL/m2
MV Dec: 204
MV pk E vel: 60.1 m/s
MVAP: 3.67 cm2
MVPKAVEL: 102 m/s
P 1/2 time: 60 ms
PW: 9.94 mm — AB (ref 0.6–1.1)
RV LATERAL S' VELOCITY: 13.7 cm/s
TAPSE: 20.7 mm
Weight: 1921.6 oz

## 2017-01-13 LAB — BASIC METABOLIC PANEL
ANION GAP: 9 (ref 5–15)
BUN: 6 mg/dL (ref 6–20)
CHLORIDE: 96 mmol/L — AB (ref 101–111)
CO2: 31 mmol/L (ref 22–32)
Calcium: 8.3 mg/dL — ABNORMAL LOW (ref 8.9–10.3)
Creatinine, Ser: 0.92 mg/dL (ref 0.61–1.24)
GFR calc Af Amer: >60 mL/min (ref >60)
GLUCOSE: 141 mg/dL — AB (ref 65–99)
POTASSIUM: 4 mmol/L (ref 3.5–5.1)
SODIUM: 136 mmol/L (ref 135–145)

## 2017-01-13 LAB — CBC
HEMATOCRIT: 29.9 % — AB (ref 40.0–52.0)
HEMOGLOBIN: 10.1 g/dL — AB (ref 13.0–18.0)
MCH: 34 pg (ref 26.0–34.0)
MCHC: 33.8 g/dL (ref 32.0–36.0)
MCV: 100.8 fL — ABNORMAL HIGH (ref 80.0–100.0)
Platelets: 70 10*3/uL — ABNORMAL LOW (ref 150–440)
RBC: 2.97 MIL/uL — AB (ref 4.40–5.90)
RDW: 18 % — ABNORMAL HIGH (ref 11.5–14.5)
WBC: 4 10*3/uL (ref 3.8–10.6)

## 2017-01-13 LAB — ETHANOL

## 2017-01-13 LAB — MAGNESIUM: MAGNESIUM: 1.6 mg/dL — AB (ref 1.7–2.4)

## 2017-01-13 LAB — PHOSPHORUS: Phosphorus: 2.1 mg/dL — ABNORMAL LOW (ref 2.5–4.6)

## 2017-01-13 MED ORDER — LORAZEPAM 2 MG/ML IJ SOLN
1.0000 mg | INTRAMUSCULAR | Status: DC | PRN
  Administered 2017-01-15 – 2017-01-17 (×4): 1 mg via INTRAVENOUS
  Filled 2017-01-13 (×4): qty 1

## 2017-01-13 MED ORDER — LORAZEPAM 2 MG/ML IJ SOLN: 1.0000 mg | Freq: Four times a day (QID) | INTRAMUSCULAR | Status: DC | PRN

## 2017-01-13 MED ORDER — MAGNESIUM SULFATE 2 GM/50ML IV SOLN
2.0000 g | Freq: Once | INTRAVENOUS | Status: AC
  Administered 2017-01-13: 2 g via INTRAVENOUS
  Filled 2017-01-13: qty 50

## 2017-01-13 MED ORDER — CIPROFLOXACIN IN D5W 400 MG/200ML IV SOLN
400.0000 mg | Freq: Two times a day (BID) | INTRAVENOUS | Status: DC
  Administered 2017-01-13 – 2017-01-14 (×3): 400 mg via INTRAVENOUS
  Filled 2017-01-13 (×4): qty 200

## 2017-01-13 MED ORDER — K PHOS MONO-SOD PHOS DI & MONO 155-852-130 MG PO TABS
500.0000 mg | ORAL_TABLET | ORAL | Status: AC
  Administered 2017-01-13 – 2017-01-14 (×3): 500 mg via ORAL
  Filled 2017-01-13 (×4): qty 2

## 2017-01-13 MED ORDER — HALOPERIDOL LACTATE 5 MG/ML IJ SOLN
2.0000 mg | Freq: Once | INTRAMUSCULAR | Status: AC
  Administered 2017-01-13: 2 mg via INTRAVENOUS

## 2017-01-13 MED ORDER — ENSURE ENLIVE PO LIQD
237.0000 mL | Freq: Three times a day (TID) | ORAL | Status: DC
  Administered 2017-01-13 – 2017-01-15 (×7): 237 mL via ORAL

## 2017-01-13 MED ORDER — HALOPERIDOL LACTATE 5 MG/ML IJ SOLN
INTRAMUSCULAR | Status: AC
  Administered 2017-01-13: 2 mg
  Filled 2017-01-13: qty 1

## 2017-01-13 MED ORDER — LORAZEPAM 2 MG PO TABS: 0.0000 mg | ORAL_TABLET | Freq: Two times a day (BID) | ORAL | Status: DC

## 2017-01-13 MED ORDER — ADULT MULTIVITAMIN W/MINERALS CH
1.0000 | ORAL_TABLET | Freq: Every day | ORAL | Status: DC
  Administered 2017-01-13 – 2017-01-17 (×4): 1 via ORAL
  Filled 2017-01-13 (×5): qty 1

## 2017-01-13 MED ORDER — METRONIDAZOLE IN NACL 5-0.79 MG/ML-% IV SOLN
500.0000 mg | Freq: Three times a day (TID) | INTRAVENOUS | Status: DC
  Administered 2017-01-13 – 2017-01-14 (×3): 500 mg via INTRAVENOUS
  Filled 2017-01-13 (×5): qty 100

## 2017-01-13 MED ORDER — LORAZEPAM 1 MG PO TABS
1.0000 mg | ORAL_TABLET | Freq: Four times a day (QID) | ORAL | Status: DC | PRN
  Administered 2017-01-13: 1 mg via ORAL
  Filled 2017-01-13 (×2): qty 1

## 2017-01-13 MED ORDER — THIAMINE HCL 100 MG/ML IJ SOLN: 100.0000 mg | Freq: Every day | INTRAMUSCULAR | Status: DC

## 2017-01-13 MED ORDER — VITAMIN B-1 100 MG PO TABS
100.0000 mg | ORAL_TABLET | Freq: Every day | ORAL | Status: DC
  Administered 2017-01-13 – 2017-01-17 (×5): 100 mg via ORAL
  Filled 2017-01-13 (×5): qty 1

## 2017-01-13 MED ORDER — LORAZEPAM 2 MG PO TABS
0.0000 mg | ORAL_TABLET | Freq: Four times a day (QID) | ORAL | Status: DC
  Administered 2017-01-13: 1 mg via ORAL

## 2017-01-13 MED ORDER — DILTIAZEM HCL ER COATED BEADS 120 MG PO CP24
240.0000 mg | ORAL_CAPSULE | Freq: Every day | ORAL | Status: DC
  Filled 2017-01-13 (×2): qty 2

## 2017-01-13 MED ORDER — FOLIC ACID 1 MG PO TABS
1.0000 mg | ORAL_TABLET | Freq: Every day | ORAL | Status: DC
  Administered 2017-01-13 – 2017-01-17 (×5): 1 mg via ORAL
  Filled 2017-01-13 (×5): qty 1

## 2017-01-13 NOTE — Progress Notes (Signed)
Pharmacy consulted for electrolyte replacement protocol:   Goal of therapy: Electrolytes within normal limits:  K 3.5 - 5.1 Corrected Ca 8.9 - 10.3 Phos 2.5 - 4.6 Mg 1.7 - 2.4   Assessment: Lab Results  Component Value Date   CREATININE 0.92 01/13/2017   BUN 6 01/13/2017   NA 136 01/13/2017   K 4.0 01/13/2017   CL 96 (L) 01/13/2017   CO2 31 01/13/2017  Phos 2.1  Mg 1.6   Plan: MD already ordered mag sulfate 2gm iv x 1, will order K Phos neutratabs 2 tabs q4h x 4 doses to replace phos. Will recheck BMP with Mg in the AM per protocol.    Luan PullingGarrett Judyann Casasola, PharmD, MBA, Liz ClaiborneBCGP Clinical Pharmacist [facility]

## 2017-01-13 NOTE — Clinical Social Work Note (Signed)
CSW attempted to meet with the patient. The patient was not rousable. CSW will follow-up.  Argentina PonderKaren Martha Cassi Jenne, MSW, Theresia MajorsLCSWA [phone]

## 2017-01-13 NOTE — Progress Notes (Signed)
Initial Nutrition Assessment  DOCUMENTATION CODES:   Severe malnutrition in context of chronic illness, Underweight  INTERVENTION:  Recommend liberalizing diet to regular.  Recommend Ensure Enlive po TID between meals, each supplement provides 350 kcal and 20 grams of protein.  Recommend Magic cup TID with meals, each supplement provides 290 kcal and 9 grams of protein.  Continue multivitamin with minerals daily, folic acid 1 mg daily, thiamine 100 mg daily.  Noted potassium and magnesium are being monitored and supplemented. Also recommend checking phosphorus as patient is at risk for refeeding syndrome in setting of severe malnutrition.  Will not place any orders as patient is now discharged.  NUTRITION DIAGNOSIS:   Severe Malnutrition related to social / environmental circumstances(possible EtOH abuse) as evidenced by severe fat depletion, severe muscle depletion.  GOAL:   Patient will meet greater than or equal to 90% of their needs  MONITOR:   PO intake, Supplement acceptance, Labs, Weight trends, Skin, I & O's  REASON FOR ASSESSMENT:   Malnutrition Screening Tool    ASSESSMENT:   77 year old male with PMhx of DM type 2 admitted with new onset A-fib with RVR, lactic acidosis, acute colitis, small infrarenal aortic dissection, thrombocytopenia, hypokalemia, hypomagnesemia.   -Noted in chart there is concern for EtOH withdrawal.  Met with patient at bedside. He was somnolent and unable to provide any history. Discussed with RN at bedside. Patient is actually now discharged. He has not been able to eat well today due to lethargy. Yesterday was able to eat some. No meal completion recorded in chart.  Limited weight history in chart so unable to trend.  Medications reviewed and include: Colace, folic acid 1 mg daily, MVI daily, potassium chloride 40 mEq BID, thiamine 100 mg daily, ciprofloxacin, magnesium sulfate 2 grams once IV, Flagyl.  Labs reviewed: Chloride  96.  NUTRITION - FOCUSED PHYSICAL EXAM:    Most Recent Value  Orbital Region  Severe depletion  Upper Arm Region  Severe depletion  Thoracic and Lumbar Region  Severe depletion  Buccal Region  Severe depletion  Temple Region  Severe depletion  Clavicle Bone Region  Severe depletion  Clavicle and Acromion Bone Region  Severe depletion  Scapular Bone Region  Severe depletion  Dorsal Hand  Severe depletion  Patellar Region  Severe depletion  Anterior Thigh Region  Severe depletion  Posterior Calf Region  Severe depletion  Edema (RD Assessment)  None  Hair  Reviewed  Eyes  Unable to assess  Mouth  Unable to assess  Skin  Reviewed  Nails  Reviewed     Diet Order:  Diet Heart Room service appropriate? Yes; Fluid consistency: Thin  EDUCATION NEEDS:   Not appropriate for education at this time  Skin:  Skin Assessment: Skin Integrity Issues: Skin Integrity Issues:: Other (Comment) Other: open wound left toe  Last BM:  01/13/2016 (no BM characteristics documented)  Height:   Ht Readings from Last 1 Encounters:  01/12/17 6' 3"  (1.905 m)    Weight:   Wt Readings from Last 1 Encounters:  01/13/17 119 lb 9.6 oz (54.3 kg)    Ideal Body Weight:  89.1 kg  BMI:  Body mass index is 14.95 kg/m.  Estimated Nutritional Needs:   Kcal:  1630-1900 (MSJ x 1.2-1.4)  Protein:  80-90 grams (1.5-1.7 grams/kg)  Fluid:  1.4-1.6 L/day (25-30 mL/kg)  Willey Blade, MS, RD, LDN Office: [phone] Pager: [phone] After Hours/Weekend Pager: [phone]

## 2017-01-13 NOTE — Consult Note (Signed)
WOC Nurse wound consult note Reason for Consult:deflated and reabsorbing blood blister at posterior aspect of right great toe Wound type:trauma vs arterial insufficiency vs neuropathic wound Pressure Injury POA: n/a Measurement:3cm x 3.5cm with depth unable to be determined Wound BJY:NWGNFAOZbed:deflated and reabsorbing blood-fill blister.  No elevation, no drainage. Drainage (amount, consistency, odor) None Periwound:Intact, dry.  Poor hygiene at toes on the right foot. Dressing procedure/placement/frequency: I have asked Nursing by way of the Orders to perform hygiene to the right foot ensuring that the intertriginous areas are cleansed and thoroughly dried.  Treatment to the deflated and reabsorbing blood blister is to paint it twice daily with a betadine swabstick and allow it to air-dry.  No dressing is required.  The astringent properties of the betadine swabstick (povodine iodine solution) will further dry the tissue, allowing for eventual atraumatic dislodgement.  WOC nursing team will not follow, but will remain available to this patient, the nursing and medical teams.  Please re-consult if needed. Thanks, Jesus MowLaurie Lucyle Alumbaugh, MSN, RN, GNP, Jesus EdenCWOCN, CWON-AP, FAAN  Pager# [phone]

## 2017-01-13 NOTE — Progress Notes (Signed)
The patient is disoriented x 2 and having hallucinations  and being impulsive. The patient stated, "I had some drink yesterday." The RN asked the patient the last time he drunk and patient indicated he drunk on   day before yesterday. Dr. Tobi BastosPyreddy notified to draw his alcohol level to make sure he's not withdrawing.  New order received to draw his alcohol lab. Will continue to monitor.

## 2017-01-13 NOTE — Progress Notes (Signed)
Patient still impulsive and stated he's going home. The Ativan oral did not work. Dr. Tobi BastosPyreddy notified and received a new order for Haldol 2 mg Iv x 1 dose and a safety sitter. Haldol already administered. Will continue to monitor.

## 2017-01-13 NOTE — Progress Notes (Signed)
Alcohol level was negative.. Patient seems to be withdrawing. He told the RN he drinks every night before he goes to bed. He still having hallucinations and impulsiveness. Has pulled his 2 IV off and noted striping off leads. Patient told the RN he needs a ride to go home. The RN has been at the bedside multiple times to re-orient patient that he's in the hospital.  Dr. Tobi BastosPyreddy notified and he initiated CIWA protocol. Patient scored 11 on the CIWA.Ativan 2 mg admistered. Will continue to monitor the effect.

## 2017-01-13 NOTE — Progress Notes (Signed)
[employer] [facility] [facility]   PATIENT NAME: Jesus Hardy    MR#:  [id number]  DATE OF BIRTH:  04-15-1940  SUBJECTIVE:  CHIEF COMPLAINT:   Chief Complaint  Patient presents with  . Loss of Consciousness     Came with abd pain and syncopal episode, noted to have a small aortic aneurysm, Finding of colitis on CT abd. Also new onset A fib with RVR.   Was completely alert and oriented yesterday, but as per nurses last night he had more agitation and required Haldol and Ativan IV and oral. Very lethargic today morning barely responding by moaning to stimuli.  REVIEW OF SYSTEMS:  As he is lethargic, not able to give review of system. ROS  DRUG ALLERGIES:  No Known Allergies  VITALS:  Blood pressure (!) 141/72, pulse 99, temperature 97.6 F (36.4 C), temperature source Oral, resp. rate 18, height 6\' 3"  (1.905 m), weight 54.3 kg (119 lb 9.6 oz), SpO2 99 %.  PHYSICAL EXAMINATION:  GENERAL:  77 y.o.-year-old malnourished patient lying in the bed with no acute distress.  EYES: Pupils equal, round, reactive to light and accommodation. No scleral icterus. Extraocular muscles intact.  HEENT: Head atraumatic, normocephalic. Oropharynx and nasopharynx clear.  NECK:  Supple, no jugular venous distention. No thyroid enlargement, no tenderness.  LUNGS: Normal breath sounds bilaterally, no wheezing, rales,rhonchi or crepitation. No use of accessory muscles of respiration.  CARDIOVASCULAR: S1, S2 normal. No murmurs, rubs, or gallops.  ABDOMEN: Soft, nontender, nondistended. Bowel sounds present. No organomegaly or mass.  EXTREMITIES: No pedal edema, cyanosis, or clubbing.  NEUROLOGIC: Very lethargic today, to painful stimuli he make moaning sounds, and moves his limbs slightly. PSYCHIATRIC: The patient is lethargic.  SKIN: No obvious rash, lesion, or ulcer.   Physical Exam LABORATORY PANEL:   CBC Recent Labs  Lab 01/13/17 0406  WBC 4.0  HGB 10.1*  HCT 29.9*  PLT  70*   ------------------------------------------------------------------------------------------------------------------  Chemistries  Recent Labs  Lab 01/11/17 2046  01/13/17 0406  NA  --    < > 136  K  --    < > 4.0  CL  --    < > 96*  CO2  --    < > 31  GLUCOSE  --    < > 141*  BUN  --    < > 6  CREATININE  --    < > 0.92  CALCIUM  --    < > 8.3*  MG  --    < > 1.6*  AST 97*  --   --   ALT 25  --   --   ALKPHOS 99  --   --   BILITOT 1.4*  --   --    < > = values in this interval not displayed.   ------------------------------------------------------------------------------------------------------------------  Cardiac Enzymes Recent Labs  Lab 01/11/17 1820  TROPONINI 0.04*   ------------------------------------------------------------------------------------------------------------------  RADIOLOGY:  Ct Head Wo Contrast  Result Date: 01/11/2017 CLINICAL DATA:  Syncope EXAM: CT HEAD WITHOUT CONTRAST TECHNIQUE: Contiguous axial images were obtained from the base of the skull through the vertex without intravenous contrast. COMPARISON:  08/20/2011 FINDINGS: Brain: There is no evidence for acute hemorrhage, hydrocephalus, mass lesion, or abnormal extra-axial fluid collection. No definite CT evidence for acute infarction. Diffuse loss of parenchymal volume is consistent with atrophy. Patchy low attenuation in the deep hemispheric and periventricular white matter is nonspecific, but likely reflects chronic microvascular ischemic demyelination. Vascular: No hyperdense vessel  or unexpected calcification. Skull: No evidence for fracture. No worrisome lytic or sclerotic lesion. Sinuses/Orbits: Chronic mucosal disease noted in the maxillary sinuses, right more ethmoid air cells, and right frontal sinus. Visualized portions of the globes and intraorbital fat are unremarkable. Other: None. IMPRESSION: 1. No acute intracranial abnormality. 2. Atrophy with chronic small vessel white matter  ischemic disease. 3. Chronic paranasal sinus disease. Electronically Signed   By: Kennith CenterEric  Mansell M.D.   On: 01/11/2017 19:41   Dg Chest Portable 1 View  Result Date: 01/11/2017 CLINICAL DATA:  Syncope EXAM: PORTABLE CHEST 1 VIEW COMPARISON:  12/26/2011 FINDINGS: 1817 hours. Lungs are hyperexpanded. The lungs are clear without focal pneumonia, edema, pneumothorax or pleural effusion. The cardiopericardial silhouette is within normal limits for size. The visualized bony structures of the thorax are intact. Telemetry leads overlie the chest. IMPRESSION: Hyperexpansion without acute findings. Electronically Signed   By: Kennith CenterEric  Mansell M.D.   On: 01/11/2017 18:54   Ct Renal Stone Study  Result Date: 01/11/2017 CLINICAL DATA:  77 y/o  M; flank pain, stone disease suspected. EXAM: CT ABDOMEN AND PELVIS WITHOUT CONTRAST TECHNIQUE: Multidetector CT imaging of the abdomen and pelvis was performed following the standard protocol without IV contrast. COMPARISON:  12/29/2012 CT abdomen and pelvis. FINDINGS: Lower chest: No acute abnormality. Hepatobiliary: Subcentimeter in segment 4B and segment 6 cysts. Stable right lobe of liver calcified granuloma. Hepatic steatosis. Cholelithiasis. No biliary ductal dilatation. Pancreas: Unremarkable. No pancreatic ductal dilatation or surrounding inflammatory changes. Spleen: Normal in size without focal abnormality. Adrenals/Urinary Tract: Normal adrenal glands. Left kidney interpolar subcentimeter hemorrhagic cyst. No other focal kidney lesion identified. No urinary stone disease. No hydronephrosis. Normal bladder. Stomach/Bowel: Normal appearance of stomach and small bowel. Mild diffuse wall thickening of the colon greatest the ascending segment cecum compatible with colitis. Extensive sigmoid diverticulosis. Vascular/Lymphatic: Aortic atherosclerosis. No enlarged abdominal or pelvic lymph nodes. Reproductive: Central prostatic calcifications. Other:  No ascites. Musculoskeletal:  Interval mild L1 and L2 anterior compression deformities. Persistent fracture lines are present, likely recent injury. Moderate multilevel degenerative changes of the spine. IMPRESSION: 1. Interval mild L1 and L2 anterior compression deformities, likely recent. 2. Mild diffuse wall thickening of the colon greatest in the ascending segment compatible with colitis. 3. Cholelithiasis. 4. No nephrolithiasis or hydronephrosis identified. 5. Extensive sigmoid diverticulosis. 6. Aortic atherosclerosis. Electronically Signed   By: Mitzi HansenLance  Furusawa-Stratton M.D.   On: 01/11/2017 19:45   Ct Angio Abd/pel W And/or Wo Contrast  Result Date: 01/11/2017 CLINICAL DATA:  Syncope today. Atrial fibrillation. Abdominal pain. EXAM: CTA ABDOMEN AND PELVIS wITHOUT AND WITH CONTRAST TECHNIQUE: Multidetector CT imaging of the abdomen and pelvis was performed using the standard protocol during bolus administration of intravenous contrast. Multiplanar reconstructed images and MIPs were obtained and reviewed to evaluate the vascular anatomy. CONTRAST:  100mL ISOVUE-370 IOPAMIDOL (ISOVUE-370) INJECTION 76% COMPARISON:  01/11/2017 at 1911 hours. FINDINGS: VASCULAR Aorta: A focal dissecting flap is demonstrated in the infrarenal abdominal aorta. This appears to be associated with the origin of a lumbar artery. The flap does not definitively extend very far superiorly or inferiorly. An atypical dissection is not excluded. The aorta remains widely patent without aneurysm. Scattered aortic calcifications. Celiac: Patent without evidence of aneurysm, dissection, vasculitis or significant stenosis. SMA: Patent without evidence of aneurysm, dissection, vasculitis or significant stenosis. Renals: Both renal arteries are patent without evidence of aneurysm, dissection, vasculitis, fibromuscular dysplasia or significant stenosis. IMA: Patent without evidence of aneurysm, dissection, vasculitis or significant stenosis. Inflow: Patent without  evidence of  aneurysm, dissection, vasculitis or significant stenosis. Proximal Outflow: Bilateral common femoral and visualized portions of the superficial and profunda femoral arteries are patent without evidence of aneurysm, dissection, vasculitis or significant stenosis. Veins: No obvious venous abnormality within the limitations of this arterial phase study. Portal and mesenteric veins are patent. Review of the MIP images confirms the above findings. NON-VASCULAR Lower chest: Mild dependent changes in the lung bases in addition to respiratory motion artifact. Coronary artery calcifications. Small esophageal hiatal hernia. Hepatobiliary: Diffuse fatty infiltration of the liver. No focal lesions identified. Layering sludge in the gallbladder. No discrete stones or wall thickening identified. Bile ducts are not dilated. Pancreas: Unremarkable. No pancreatic ductal dilatation or surrounding inflammatory changes. Spleen: Normal in size without focal abnormality. Adrenals/Urinary Tract: No adrenal gland nodules. Kidneys are symmetrical. Nephrograms are symmetrical. Mild prominence of the renal collecting systems bilaterally but no stone or focal obstruction is demonstrated. Bladder wall is mildly thickened which may indicate cystitis. Stomach/Bowel: Stomach, small bowel, and colon are decompressed, limiting evaluation for wall thickening. There is contrast material in the colon. Diffuse diverticulosis of the descending and sigmoid colon. No evidence of diverticulitis. Appendix is normal. Lymphatic: No significant lymphadenopathy. Reproductive: Prostate is unremarkable. Other: No abdominal wall hernia or abnormality. No abdominopelvic ascites. Musculoskeletal: Degenerative changes in the lumbar spine. Compression deformities at multiple lumbar vertebrae as demonstrated on the previous study from earlier today. Sclerosis in the L1 and L2 compression fractures may indicate recent fractures. IMPRESSION: VASCULAR A small focal  dissecting flap is demonstrated in the infrarenal abdominal aorta without significant progression. An atypical dissection is not excluded. Aortic atherosclerosis. NON-VASCULAR 1. Diffuse fatty infiltration of the liver. 2. Layering sludge in the gallbladder without obvious inflammatory signs. 3. Mild prominence of renal collecting systems bilaterally is likely physiologic. No stone or focal obstruction is demonstrated. 4. Bladder wall thickening may indicate cystitis. 5. Colonic diverticulosis without evidence of diverticulitis. 6. Multiple compression deformities in the lumbar spine. L1 and L2 compression may be recent. No change since prior study from earlier today. These results were called by telephone at the time of interpretation on 01/11/2017 at 11:11 pm to Dr. Willy Eddy , who verbally acknowledged these results. Electronically Signed   By: Burman Nieves M.D.   On: 01/11/2017 23:16    ASSESSMENT AND PLAN:   Active Problems:   New onset a-fib (HCC)   Protein-calorie malnutrition, severe  1.  New onset atrial fibrillation with RVR,   on Cardizem drip.    Converted to normal sinus rhythm. Switch to oral as per recommendation by cardiology.   Echocardiogram.   As this happened during an episode of infection and patient has finding of small aortic aneurysm, he is not a candidate for long-term anticoagulation.  Due to lethargic, not able to do overall medication today so far, but still in normal sinus rhythm, continue monitoring.  2.  Lactic acidosis   monitor for mesenteric ischemia.    sepsis,  due to colitis.   Given IV fluids.  3.  Acute colitis, per abd CT started on IV antibiotics.  Patient has abdominal pain but he denies nausea vomiting or diarrhea.  We will continue to monitor clinically closely.   Start on diet today as no pain.   He was on IV vancomycin and Zosyn, but I changed to Cipro plus Flagyl because of colitis.  4.  Small infrarenal aortic dissection.  This was  discussed with vascular surgery and the recommendation was for medical  management, keep the blood pressure well controlled. Avoid anticoagulation.  5. Thrombocytopenia   Stopped heparin subcutaneous, continue monitoring.  * Hypokalemia, hypomagnesemia   Replace, recheck in the morning.   * Altered mental status   Patient is on his CIWA protocol, and due to agitation early morning received oral and IV Ativan. Likely this is the cause for him being lethargic today until now.   As patient has A. fib and he is not on anti-coagulation I will would also like to rule out a stroke so we will check CT on the head.  * Severe malnutrition   Electrolyte imbalance   With a dietitian consult once he is more alert, pharmacy to help replacing electrolytes including magnesium and phosphorus.  records are reviewed and case discussed with Care Management/Social Workerr. Management plans discussed with the patient, family and they are in agreement.  CODE STATUS: Full code.  TOTAL TIME TAKING CARE OF THIS PATIENT35  minutes.   Discussed with cardiologist.  POSSIBLE D/C1-2DAYS, DEPENDING ON CLINICAL CONDITION.   Altamese Dilling M.D on 01/13/2017   Between 7am to 6pm - Pager - [phone]  After 6pm go to [email or web address] - password [email or web address]  [facility]  Office  [phone]  CC: Primary care physician; [place]  Note: This dictation was prepared with Dragon dictation along with smaller phrase technology. Any transcriptional errors that result from this process are unintentional.

## 2017-01-13 NOTE — Progress Notes (Signed)
Two IVs inserted. Patient tolerated well

## 2017-01-13 NOTE — Progress Notes (Signed)
Did not complete CIWA assessment at this time, as patient is far too lethargic to answer questions in which he is required to rate his level of various items, such as anxiety. Physician aware of current state. Patient resting quietly in the bed at this time, no impulsive behavior. No tremors witnessed. Clearly doesn't appear to need any Ativan at this time. Will complete another CIWA assessment later in shift if in fact patient still admitted. At time of this entry discharge order already written. Will continue to monitor. Jari FavreSteven M [facility]

## 2017-01-13 NOTE — Care Management Note (Addendum)
Case Management Note  Patient Details  Name: Jesus Hardy MRN: [id number] Date of Birth: 04/13/1940  Subjective/Objective:  Barriers to discharge: Extremely lethargic. Nurses unable to do CIWA per patient unable to participate. Confused. Incontinent of urine. Homeless, reportedly lives in a parking garage. Discussed discharge planning with Dr Esaw GrandchildVachanni and weekend SWer. Positives: Jesus Hardy is insured.                 Action/Plan:   Expected Discharge Date:  01/13/17               Expected Discharge Plan:     In-House Referral:     Discharge planning Services     Post Acute Care Choice:    Choice offered to:     DME Arranged:    DME Agency:     HH Arranged:    HH Agency:     Status of Service:     If discussed at [employer] Meetings, dates discussed:    Additional Comments:  Shandale Malak A, RN 01/13/2017, 11:49 AM

## 2017-01-13 NOTE — Progress Notes (Signed)
I, along with the help of another RN, just completed a complete bed and gown change for the patient at this time. Bedding covered in urine and food particles. Patient confused and sometimes visibly shaky. Gave patient all AM PO medications, patient immediately began chewing them all. Later, set patient up to eat breakfast, claims doesn't need assistance with consuming breakfast. Patient not impulsive at this time, will hold off on setting up tele-sitter at this time. Will re-evaluate if needed. Jesus FavreSteven M [facility]

## 2017-01-13 NOTE — Progress Notes (Signed)
[facility] Cardiology  SUBJECTIVE: Patient laying in bed, somnolent, confused, concern for alcohol withdrawal, on lorazepam   Vitals:   01/12/17 2023 01/13/17 0127 01/13/17 0249 01/13/17 0732  BP:   136/70 (!) 141/72  Pulse:   100 99  Resp:    18  Temp: 98.5 F (36.9 C)  97.8 F (36.6 C) 97.6 F (36.4 C)  TempSrc: Oral  Oral Oral  SpO2:   100% 99%  Weight:  54.3 kg (119 lb 9.6 oz)    Height:         Intake/Output Summary (Last 24 hours) at 01/13/2017 1052 Last data filed at 01/13/2017 0315 Gross per 24 hour  Intake 150 ml  Output 200 ml  Net -50 ml      PHYSICAL EXAM  General: Well developed, well nourished, in no acute distress HEENT:  Normocephalic and atramatic Neck:  No JVD.  Lungs: Clear bilaterally to auscultation and percussion. Heart: HRRR . Normal S1 and S2 without gallops or murmurs.  Abdomen: Bowel sounds are positive, abdomen soft and non-tender  Msk:  Back normal, normal gait. Normal strength and tone for age. Extremities: No clubbing, cyanosis or edema.   Neuro: Alert and oriented X 3. Psych:  Good affect, responds appropriately   LABS: Basic Metabolic Panel: Recent Labs    01/12/17 0721 01/13/17 0406  NA 136 136  K 2.7* 4.0  CL 94* 96*  CO2 31 31  GLUCOSE 144* 141*  BUN 6 6  CREATININE 0.85 0.92  CALCIUM 7.6* 8.3*  MG 1.2* 1.6*   Liver Function Tests: Recent Labs    01/11/17 2046  AST 97*  ALT 25  ALKPHOS 99  BILITOT 1.4*  PROT 5.8*  ALBUMIN 2.7*   No results for input(s): LIPASE, AMYLASE in the last 72 hours. CBC: Recent Labs    01/12/17 0721 01/13/17 0406  WBC 3.4* 4.0  HGB 10.9* 10.1*  HCT 31.9* 29.9*  MCV 101.2* 100.8*  PLT 68* 70*   Cardiac Enzymes: Recent Labs    01/11/17 1820  TROPONINI 0.04*   BNP: Invalid input(s): POCBNP D-Dimer: No results for input(s): DDIMER in the last 72 hours. Hemoglobin A1C: No results for input(s): HGBA1C in the last 72 hours. Fasting Lipid Panel: No results for input(s): CHOL, HDL,  LDLCALC, TRIG, CHOLHDL, LDLDIRECT in the last 72 hours. Thyroid Function Tests: No results for input(s): TSH, T4TOTAL, T3FREE, THYROIDAB in the last 72 hours.  Invalid input(s): FREET3 Anemia Panel: No results for input(s): VITAMINB12, FOLATE, FERRITIN, TIBC, IRON, RETICCTPCT in the last 72 hours.  Ct Head Wo Contrast  Result Date: 01/11/2017 CLINICAL DATA:  Syncope EXAM: CT HEAD WITHOUT CONTRAST TECHNIQUE: Contiguous axial images were obtained from the base of the skull through the vertex without intravenous contrast. COMPARISON:  08/20/2011 FINDINGS: Brain: There is no evidence for acute hemorrhage, hydrocephalus, mass lesion, or abnormal extra-axial fluid collection. No definite CT evidence for acute infarction. Diffuse loss of parenchymal volume is consistent with atrophy. Patchy low attenuation in the deep hemispheric and periventricular white matter is nonspecific, but likely reflects chronic microvascular ischemic demyelination. Vascular: No hyperdense vessel or unexpected calcification. Skull: No evidence for fracture. No worrisome lytic or sclerotic lesion. Sinuses/Orbits: Chronic mucosal disease noted in the maxillary sinuses, right more ethmoid air cells, and right frontal sinus. Visualized portions of the globes and intraorbital fat are unremarkable. Other: None. IMPRESSION: 1. No acute intracranial abnormality. 2. Atrophy with chronic small vessel white matter ischemic disease. 3. Chronic paranasal sinus disease. Electronically Signed  By: Kennith CenterEric  Mansell M.D.   On: 01/11/2017 19:41   Dg Chest Portable 1 View  Result Date: 01/11/2017 CLINICAL DATA:  Syncope EXAM: PORTABLE CHEST 1 VIEW COMPARISON:  12/26/2011 FINDINGS: 1817 hours. Lungs are hyperexpanded. The lungs are clear without focal pneumonia, edema, pneumothorax or pleural effusion. The cardiopericardial silhouette is within normal limits for size. The visualized bony structures of the thorax are intact. Telemetry leads overlie the  chest. IMPRESSION: Hyperexpansion without acute findings. Electronically Signed   By: Kennith CenterEric  Mansell M.D.   On: 01/11/2017 18:54   Ct Renal Stone Study  Result Date: 01/11/2017 CLINICAL DATA:  77 y/o  M; flank pain, stone disease suspected. EXAM: CT ABDOMEN AND PELVIS WITHOUT CONTRAST TECHNIQUE: Multidetector CT imaging of the abdomen and pelvis was performed following the standard protocol without IV contrast. COMPARISON:  12/29/2012 CT abdomen and pelvis. FINDINGS: Lower chest: No acute abnormality. Hepatobiliary: Subcentimeter in segment 4B and segment 6 cysts. Stable right lobe of liver calcified granuloma. Hepatic steatosis. Cholelithiasis. No biliary ductal dilatation. Pancreas: Unremarkable. No pancreatic ductal dilatation or surrounding inflammatory changes. Spleen: Normal in size without focal abnormality. Adrenals/Urinary Tract: Normal adrenal glands. Left kidney interpolar subcentimeter hemorrhagic cyst. No other focal kidney lesion identified. No urinary stone disease. No hydronephrosis. Normal bladder. Stomach/Bowel: Normal appearance of stomach and small bowel. Mild diffuse wall thickening of the colon greatest the ascending segment cecum compatible with colitis. Extensive sigmoid diverticulosis. Vascular/Lymphatic: Aortic atherosclerosis. No enlarged abdominal or pelvic lymph nodes. Reproductive: Central prostatic calcifications. Other:  No ascites. Musculoskeletal: Interval mild L1 and L2 anterior compression deformities. Persistent fracture lines are present, likely recent injury. Moderate multilevel degenerative changes of the spine. IMPRESSION: 1. Interval mild L1 and L2 anterior compression deformities, likely recent. 2. Mild diffuse wall thickening of the colon greatest in the ascending segment compatible with colitis. 3. Cholelithiasis. 4. No nephrolithiasis or hydronephrosis identified. 5. Extensive sigmoid diverticulosis. 6. Aortic atherosclerosis. Electronically Signed   By: Mitzi HansenLance   Furusawa-Stratton M.D.   On: 01/11/2017 19:45   Ct Angio Abd/pel W And/or Wo Contrast  Result Date: 01/11/2017 CLINICAL DATA:  Syncope today. Atrial fibrillation. Abdominal pain. EXAM: CTA ABDOMEN AND PELVIS wITHOUT AND WITH CONTRAST TECHNIQUE: Multidetector CT imaging of the abdomen and pelvis was performed using the standard protocol during bolus administration of intravenous contrast. Multiplanar reconstructed images and MIPs were obtained and reviewed to evaluate the vascular anatomy. CONTRAST:  100mL ISOVUE-370 IOPAMIDOL (ISOVUE-370) INJECTION 76% COMPARISON:  01/11/2017 at 1911 hours. FINDINGS: VASCULAR Aorta: A focal dissecting flap is demonstrated in the infrarenal abdominal aorta. This appears to be associated with the origin of a lumbar artery. The flap does not definitively extend very far superiorly or inferiorly. An atypical dissection is not excluded. The aorta remains widely patent without aneurysm. Scattered aortic calcifications. Celiac: Patent without evidence of aneurysm, dissection, vasculitis or significant stenosis. SMA: Patent without evidence of aneurysm, dissection, vasculitis or significant stenosis. Renals: Both renal arteries are patent without evidence of aneurysm, dissection, vasculitis, fibromuscular dysplasia or significant stenosis. IMA: Patent without evidence of aneurysm, dissection, vasculitis or significant stenosis. Inflow: Patent without evidence of aneurysm, dissection, vasculitis or significant stenosis. Proximal Outflow: Bilateral common femoral and visualized portions of the superficial and profunda femoral arteries are patent without evidence of aneurysm, dissection, vasculitis or significant stenosis. Veins: No obvious venous abnormality within the limitations of this arterial phase study. Portal and mesenteric veins are patent. Review of the MIP images confirms the above findings. NON-VASCULAR Lower chest: Mild dependent changes in  the lung bases in addition to  respiratory motion artifact. Coronary artery calcifications. Small esophageal hiatal hernia. Hepatobiliary: Diffuse fatty infiltration of the liver. No focal lesions identified. Layering sludge in the gallbladder. No discrete stones or wall thickening identified. Bile ducts are not dilated. Pancreas: Unremarkable. No pancreatic ductal dilatation or surrounding inflammatory changes. Spleen: Normal in size without focal abnormality. Adrenals/Urinary Tract: No adrenal gland nodules. Kidneys are symmetrical. Nephrograms are symmetrical. Mild prominence of the renal collecting systems bilaterally but no stone or focal obstruction is demonstrated. Bladder wall is mildly thickened which may indicate cystitis. Stomach/Bowel: Stomach, small bowel, and colon are decompressed, limiting evaluation for wall thickening. There is contrast material in the colon. Diffuse diverticulosis of the descending and sigmoid colon. No evidence of diverticulitis. Appendix is normal. Lymphatic: No significant lymphadenopathy. Reproductive: Prostate is unremarkable. Other: No abdominal wall hernia or abnormality. No abdominopelvic ascites. Musculoskeletal: Degenerative changes in the lumbar spine. Compression deformities at multiple lumbar vertebrae as demonstrated on the previous study from earlier today. Sclerosis in the L1 and L2 compression fractures may indicate recent fractures. IMPRESSION: VASCULAR A small focal dissecting flap is demonstrated in the infrarenal abdominal aorta without significant progression. An atypical dissection is not excluded. Aortic atherosclerosis. NON-VASCULAR 1. Diffuse fatty infiltration of the liver. 2. Layering sludge in the gallbladder without obvious inflammatory signs. 3. Mild prominence of renal collecting systems bilaterally is likely physiologic. No stone or focal obstruction is demonstrated. 4. Bladder wall thickening may indicate cystitis. 5. Colonic diverticulosis without evidence of diverticulitis.  6. Multiple compression deformities in the lumbar spine. L1 and L2 compression may be recent. No change since prior study from earlier today. These results were called by telephone at the time of interpretation on 01/11/2017 at 11:11 pm to Dr. Willy Eddy , who verbally acknowledged these results. Electronically Signed   By: Burman Nieves M.D.   On: 01/11/2017 23:16     Echo pending  TELEMETRY: Sinus rhythm:  ASSESSMENT AND PLAN:  Active Problems:   New onset a-fib (HCC)    1.  Atrial fibrillation, in the setting of acute colitis, converted to sinus rhythm on Cardizem drip, maintaining sinus rhythm on p.o. Cardizem 2.  Acute colitis 3.  Lactic acidosis 4.  Small infrarenal aortic dissection 5.  Decreased mental status, confusion, somnolence, likely alcohol withdrawal, on lorazepam  Recommendations  1.  Continue current medications 2.  Change Cardizem to long-acting 3.  Defer chronic anticoagulation in light of patient's infrarenal aortic dissection 4.  Review 2D echocardiogram  Sign off for now, please call if any questions   Marcina Millard, MD, PhD, [facility] 01/13/2017 10:52 AM

## 2017-01-13 NOTE — Progress Notes (Signed)
Wound care to R great toe completed at this time for the shift, as ordered. Will continue to monitor. Haroon Shatto M Brecken Walth 

## 2017-01-13 NOTE — Discharge Instructions (Signed)
Nutrition Post Hospital Stay °Proper nutrition can help your body recover from illness and injury.   °Foods and beverages high in protein, vitamins, and minerals help rebuild muscle loss, promote healing, & reduce fall risk.  ° °•In addition to eating healthy foods, a nutrition shake is an easy, delicious way to get the nutrition you need during and after your hospital stay ° °It is recommended that you continue to drink 3 bottles per day of:       Ensure Enlive for at least 1 month (30 days) after your hospital stay  ° °Tips for adding a nutrition shake into your routine: °As allowed, drink one with vitamins or medications instead of water or juice °Enjoy one as a tasty mid-morning or afternoon snack °Drink cold or make a milkshake out of it °Drink one instead of milk with cereal or snacks °Use as a coffee creamer °  °Available at the following grocery stores and pharmacies:           °* [employer] * [employer] * [employer]  °* R[employer] Aid          * Walmart * [employer]  °* Walgreens      * Target  * BJ's   °* CVS  * [employer]   °* [facility] [phone]  °          °For COUPONS visit: www.ensure.com/join or www.[email or web address]/members/sign-up  ° °Suggested Substitutions °Ensure Plus = Boost Plus = Carnation Breakfast Essentials = Boost Compact °Ensure Active Clear = Boost Medlin °Glucerna Shake = Boost Glucose Control = Carnation Breakfast Essentials SUGAR FREE ° °  ° °

## 2017-01-14 LAB — BASIC METABOLIC PANEL
ANION GAP: 8 (ref 5–15)
BUN: 6 mg/dL (ref 6–20)
CALCIUM: 8.4 mg/dL — AB (ref 8.9–10.3)
CHLORIDE: 102 mmol/L (ref 101–111)
CO2: 27 mmol/L (ref 22–32)
Creatinine, Ser: 0.68 mg/dL (ref 0.61–1.24)
GFR calc non Af Amer: >60 mL/min (ref >60)
Glucose, Bld: 156 mg/dL — ABNORMAL HIGH (ref 65–99)
Potassium: 4.6 mmol/L (ref 3.5–5.1)
Sodium: 137 mmol/L (ref 135–145)

## 2017-01-14 LAB — MAGNESIUM: Magnesium: 1.5 mg/dL — ABNORMAL LOW (ref 1.7–2.4)

## 2017-01-14 LAB — GLUCOSE, CAPILLARY: GLUCOSE-CAPILLARY: 135 mg/dL — AB (ref 65–99)

## 2017-01-14 LAB — PHOSPHORUS: PHOSPHORUS: 3.9 mg/dL (ref 2.5–4.6)

## 2017-01-14 MED ORDER — LISINOPRIL 5 MG PO TABS
5.0000 mg | ORAL_TABLET | Freq: Every day | ORAL | Status: DC
  Administered 2017-01-14 – 2017-01-17 (×4): 5 mg via ORAL
  Filled 2017-01-14 (×4): qty 1

## 2017-01-14 MED ORDER — METRONIDAZOLE 500 MG PO TABS
500.0000 mg | ORAL_TABLET | Freq: Three times a day (TID) | ORAL | Status: DC
Start: 2017-01-14 — End: 2017-01-17
  Administered 2017-01-14 – 2017-01-16 (×7): 500 mg via ORAL
  Filled 2017-01-14 (×11): qty 1

## 2017-01-14 MED ORDER — CIPROFLOXACIN HCL 500 MG PO TABS
500.0000 mg | ORAL_TABLET | Freq: Two times a day (BID) | ORAL | Status: DC
  Administered 2017-01-14 – 2017-01-17 (×6): 500 mg via ORAL
  Filled 2017-01-14 (×7): qty 1

## 2017-01-14 MED ORDER — MAGNESIUM SULFATE 4 GM/100ML IV SOLN
4.0000 g | Freq: Once | INTRAVENOUS | Status: AC
  Administered 2017-01-14: 4 g via INTRAVENOUS
  Filled 2017-01-14: qty 100

## 2017-01-14 MED ORDER — METOPROLOL TARTRATE 25 MG PO TABS
25.0000 mg | ORAL_TABLET | Freq: Two times a day (BID) | ORAL | Status: DC
  Administered 2017-01-14 – 2017-01-17 (×7): 25 mg via ORAL
  Filled 2017-01-14 (×7): qty 1

## 2017-01-14 NOTE — Clinical Social Work Note (Signed)
CSW received referral for homeless issues.  Patient lives in a garage with electricity air, and a microwave living on a friend's property patient does not express any concerns.  Case discussed with case manager and plan is to discharge home with home health.  CSW to sign off please re-consult if social work needs arise.  Ervin KnackEric R. Javonni Macke, MSW, [employer]WA [phone]

## 2017-01-14 NOTE — Care Management Note (Signed)
Case Management Note  Patient Details  Name: Jesus Hardy MRN: [id number] Date of Birth: 10/14/1940  Subjective/Objective:                 CM consult present because patient lives in a garage. Says it is on a friend's property.  It has heat, has a TV, microwave.   Admitted with new onset atrial fib and acute colitis with increase in lactic acid. Independent in all adls, denies issues accessing medical care, obtaining medications or with transportation. Says he sees his PCP regularly. CM not sure if this is believable. Says CM can speak with his brother but does not know the number.  Expected Discharge Date:  01/13/17               Expected Discharge Plan:     In-House Referral:     Discharge planning Services     Post Acute Care Choice:    Choice offered to:     DME Arranged:    DME Agency:     HH Arranged:    HH Agency:     Status of Service:     If discussed at [employer] Meetings, dates discussed:    Additional Comments:  Jesus Hardy, Jesus Lia R, RN 01/14/2017, 8:45 AM

## 2017-01-14 NOTE — Progress Notes (Signed)
The patient is alert and oriented x 3 this morning and was able to tell the RN his name, DOB and that [place]  [place]. The patient requested for something to eat and was offered sandwich tray and apple juice. The patient has just finish eating his food. CIWA still in progress and scored 3 at midnight. Denied any acute pain. Will continue to monitor.

## 2017-01-14 NOTE — Progress Notes (Signed)
[employer] [facility] [place] [facility]   PATIENT NAME: Jesus Hardy    MR#:  [id number]  DATE OF BIRTH:  April 07, 1940  SUBJECTIVE:  CHIEF COMPLAINT:   Chief Complaint  Patient presents with  . Loss of Consciousness     Came with abd pain and syncopal episode, noted to have a small aortic aneurysm, Finding of colitis on CT abd. Also new onset A fib with RVR. Was very lethargic yesterday, secondary to multiple sedating medication he received early morning. But since last evening he is more awake and today he is alert and oriented again. Generalized weakness.  REVIEW OF SYSTEMS:   Review of Systems  Constitutional: Positive for malaise/fatigue. Negative for chills, fever and weight loss.  HENT: Negative for congestion, ear discharge, sore throat and tinnitus.   Eyes: Negative for blurred vision and double vision.  Respiratory: Negative for cough, sputum production and shortness of breath.   Cardiovascular: Negative for chest pain, palpitations and leg swelling.  Gastrointestinal: Negative for abdominal pain, melena, nausea and vomiting.  Genitourinary: Negative for frequency and urgency.  Musculoskeletal: Negative for back pain and joint pain.  Neurological: Positive for weakness. Negative for dizziness, tremors and focal weakness.  Psychiatric/Behavioral: Negative for depression.    DRUG ALLERGIES:  No Known Allergies  VITALS:  Blood pressure 133/78, pulse 94, temperature 98.3 F (36.8 C), temperature source Oral, resp. rate 16, height 6\' 3"  (1.905 m), weight 54.3 kg (119 lb 11.2 oz), SpO2 100 %.  PHYSICAL EXAMINATION:  GENERAL:  77 y.o.-year-old malnourished patient lying in the bed with no acute distress.  EYES: Pupils equal, round, reactive to light and accommodation. No scleral icterus. Extraocular muscles intact.  HEENT: Head atraumatic, normocephalic. Oropharynx and nasopharynx clear.  NECK:  Supple, no jugular venous distention. No thyroid enlargement, no  tenderness.  LUNGS: Normal breath sounds bilaterally, no wheezing, rales,rhonchi or crepitation. No use of accessory muscles of respiration.  CARDIOVASCULAR: S1, S2 normal. No murmurs, rubs, or gallops.  ABDOMEN: Soft, nontender, nondistended. Bowel sounds present. No organomegaly or mass.  EXTREMITIES: No pedal edema, cyanosis, or clubbing.  NEUROLOGIC: alert and very weak today, moves limbs and follows commands. PSYCHIATRIC: The patient is lethargic.  SKIN: No obvious rash, lesion, or ulcer.   Physical Exam LABORATORY PANEL:   CBC Recent Labs  Lab 01/13/17 0406  WBC 4.0  HGB 10.1*  HCT 29.9*  PLT 70*   ------------------------------------------------------------------------------------------------------------------  Chemistries  Recent Labs  Lab 01/11/17 2046  01/14/17 0604  NA  --    < > 137  K  --    < > 4.6  CL  --    < > 102  CO2  --    < > 27  GLUCOSE  --    < > 156*  BUN  --    < > 6  CREATININE  --    < > 0.68  CALCIUM  --    < > 8.4*  MG  --    < > 1.5*  AST 97*  --   --   ALT 25  --   --   ALKPHOS 99  --   --   BILITOT 1.4*  --   --    < > = values in this interval not displayed.   ------------------------------------------------------------------------------------------------------------------  Cardiac Enzymes Recent Labs  Lab 01/11/17 1820  TROPONINI 0.04*   ------------------------------------------------------------------------------------------------------------------  RADIOLOGY:  No results found.  ASSESSMENT AND PLAN:   Active Problems:   New  onset a-fib (HCC)   Protein-calorie malnutrition, severe  1.  New onset atrial fibrillation with RVR,   on Cardizem drip.    Converted to normal sinus rhythm. Switch to oral as per recommendation by cardiology.   Echocardiogram.   As this happened during an episode of infection and patient has finding of small aortic aneurysm, he is not a candidate for long-term anticoagulation.  Due to  lethargic, did not get oral meds yesterday, still in NSR.  EF noted to be low on Echo- change meds to metoprolol and lisinopril.  Advise to follow in cardio clinic.    2.  Lactic acidosis   monitor for mesenteric ischemia.    sepsis,  due to colitis.   Given IV fluids.  3.  Acute colitis, per abd CT started on IV antibiotics.  Patient has abdominal pain but he denies nausea vomiting or diarrhea.  We will continue to monitor clinically closely.   Start on diet today as no pain.   He was on IV vancomycin and Zosyn, but I changed to Cipro plus Flagyl because of colitis.  4.  Small infrarenal aortic dissection.  This was discussed with vascular surgery and the recommendation was for medical management, keep the blood pressure well controlled. Avoid anticoagulation.  5. Thrombocytopenia   Stopped heparin subcutaneous, continue monitoring.  * Hypokalemia, hypomagnesemia   Replace, recheck in the morning.   * Altered mental status   Patient is on his CIWA protocol, and due to agitation early morning received oral and IV Ativan. Likely this is the cause for him being lethargic  Also suspected of him having a stroke yesterday and was planning to do a CT head but later on he was completely alert and oriented and was able to move all limbs so I DC'd CT head.  * Severe malnutrition   Electrolyte imbalance   With a dietitian consult once he is more alert, pharmacy to help replacing electrolytes including magnesium and phosphorus.  records are reviewed and case discussed with Care Management/Social Workerr. Management plans discussed with the patient, family and they are in agreement.  CODE STATUS: Full code.  TOTAL TIME TAKING CARE OF THIS PATIENT35  minutes.   Discussed with cardiologist.  POSSIBLE D/C1-2DAYS, DEPENDING ON CLINICAL CONDITION.   Altamese DillingVaibhavkumar Rock Sobol M.D on 01/14/2017   Between 7am to 6pm - Pager - [phone]  After 6pm go to [email or web address] - password [email or web address]  [facility]  [facility] [place] [facility]  Office  [phone]  CC: Primary care physician; [place]  Note: This dictation was prepared with Dragon dictation along with smaller phrase technology. Any transcriptional errors that result from this process are unintentional.

## 2017-01-14 NOTE — Progress Notes (Signed)
Pharmacy consulted for electrolyte replacement protocol:   Goal of therapy: Electrolytes within normal limits:  K 3.5 - 5.1 Corrected Ca 8.9 - 10.3 Phos 2.5 - 4.6 Mg 1.7 - 2.4   Assessment: Lab Results  Component Value Date   CREATININE 0.68 01/14/2017   BUN 6 01/14/2017   NA 137 01/14/2017   K 4.6 01/14/2017   CL 102 01/14/2017   CO2 27 01/14/2017  Phos 3.9 Mg 1.5   Plan: Mg 4g IV x 1. Recheck all electrolytes in the AM   Ardice Boyan D Richanda Darin, Pharm.D, BCPS Clinical Pharmacist

## 2017-01-15 LAB — BASIC METABOLIC PANEL
Anion gap: 7 (ref 5–15)
BUN: 12 mg/dL (ref 6–20)
CALCIUM: 8.9 mg/dL (ref 8.9–10.3)
CO2: 26 mmol/L (ref 22–32)
Chloride: 102 mmol/L (ref 101–111)
Creatinine, Ser: 0.92 mg/dL (ref 0.61–1.24)
GFR calc non Af Amer: >60 mL/min (ref >60)
Glucose, Bld: 118 mg/dL — ABNORMAL HIGH (ref 65–99)
Potassium: 5.5 mmol/L — ABNORMAL HIGH (ref 3.5–5.1)
SODIUM: 135 mmol/L (ref 135–145)

## 2017-01-15 LAB — CBC
HCT: 29.3 % — ABNORMAL LOW (ref 40.0–52.0)
Hemoglobin: 9.9 g/dL — ABNORMAL LOW (ref 13.0–18.0)
MCH: 34.3 pg — ABNORMAL HIGH (ref 26.0–34.0)
MCHC: 33.9 g/dL (ref 32.0–36.0)
MCV: 101.3 fL — ABNORMAL HIGH (ref 80.0–100.0)
Platelets: 68 10*3/uL — ABNORMAL LOW (ref 150–440)
RBC: 2.89 MIL/uL — ABNORMAL LOW (ref 4.40–5.90)
RDW: 18.1 % — AB (ref 11.5–14.5)
WBC: 3.7 10*3/uL — ABNORMAL LOW (ref 3.8–10.6)

## 2017-01-15 LAB — MAGNESIUM: MAGNESIUM: 1.9 mg/dL (ref 1.7–2.4)

## 2017-01-15 LAB — GLUCOSE, CAPILLARY
GLUCOSE-CAPILLARY: 109 mg/dL — AB (ref 65–99)
Glucose-Capillary: 124 mg/dL — ABNORMAL HIGH (ref 65–99)

## 2017-01-15 LAB — PHOSPHORUS: PHOSPHORUS: 4.3 mg/dL (ref 2.5–4.6)

## 2017-01-15 LAB — POTASSIUM: Potassium: 5.7 mmol/L — ABNORMAL HIGH (ref 3.5–5.1)

## 2017-01-15 MED ORDER — SODIUM POLYSTYRENE SULFONATE 15 GM/60ML PO SUSP
15.0000 g | Freq: Once | ORAL | Status: AC
  Administered 2017-01-15: 15 g via ORAL
  Filled 2017-01-15 (×2): qty 60

## 2017-01-15 MED ORDER — HALOPERIDOL LACTATE 5 MG/ML IJ SOLN
2.0000 mg | Freq: Once | INTRAMUSCULAR | Status: AC
  Administered 2017-01-15: 2 mg via INTRAVENOUS
  Filled 2017-01-15: qty 1

## 2017-01-15 NOTE — Care Management (Signed)
Today patient is not able to answer questions as he did 1/7.  He seems confused, unable to give names of friends or family members.   CM now has concerns as to whether he can return to his current living situation- in the garage of an acquaintance/ friends.  CM attempted to speak with his sister Gerri Linsunice Watlington over the phone.  She is very hard of hearing and unable to understand questions..  CM spoke with her son in law who says "no one really has kept in touch with patient and unaware of patient's baseline". None were aware that patient in the hospital.  Son in law says his wife- Ms Glynn OctaveWatlington's daughter will visit patient later this afternoon after work. Update CSW and attending.  Physical therapy consult is pending.  Patient's high potassium level this morning was contraindication to treatment.

## 2017-01-15 NOTE — Progress Notes (Signed)
[facility] [place] [facility]   PATIENT NAME: Jesus PernaWilliam Hardy    MR#:  [id number]  DATE OF BIRTH:  09/23/1940  SUBJECTIVE:  CHIEF COMPLAINT:   Chief Complaint  Patient presents with  . Loss of Consciousness     Came with abd pain and syncopal episode, noted to have a small aortic aneurysm, Finding of colitis on CT abd. Also new onset A fib with RVR.  Generalized weakness.   More confused again today.  REVIEW OF SYSTEMS:   Review of Systems  Constitutional: Positive for malaise/fatigue. Negative for chills, fever and weight loss.  HENT: Negative for congestion, ear discharge, sore throat and tinnitus.   Eyes: Negative for blurred vision and double vision.  Respiratory: Negative for cough, sputum production and shortness of breath.   Cardiovascular: Negative for chest pain, palpitations and leg swelling.  Gastrointestinal: Negative for abdominal pain, melena, nausea and vomiting.  Genitourinary: Negative for frequency and urgency.  Musculoskeletal: Negative for back pain and joint pain.  Neurological: Positive for weakness. Negative for dizziness, tremors and focal weakness.  Psychiatric/Behavioral: Negative for depression.    DRUG ALLERGIES:  No Known Allergies  VITALS:  Blood pressure 114/62, pulse 72, temperature 98 F (36.7 C), temperature source Oral, resp. rate 14, height 6\' 3"  (1.905 m), weight 55 kg (121 lb 4.8 oz), SpO2 99 %.  PHYSICAL EXAMINATION:  GENERAL:  77 y.o.-year-old malnourished patient lying in the bed with no acute distress.  EYES: Pupils equal, round, reactive to light and accommodation. No scleral icterus. Extraocular muscles intact.  HEENT: Head atraumatic, normocephalic. Oropharynx and nasopharynx clear.  NECK:  Supple, no jugular venous distention. No thyroid enlargement, no tenderness.  LUNGS: Normal breath sounds bilaterally, no wheezing, rales,rhonchi or crepitation. No use of accessory muscles of respiration.   CARDIOVASCULAR: S1, S2 normal. No murmurs, rubs, or gallops.  ABDOMEN: Soft, nontender, nondistended. Bowel sounds present. No organomegaly or mass.  EXTREMITIES: No pedal edema, cyanosis, or clubbing.  NEUROLOGIC: alert and awake today, moves limbs and follows commands. PSYCHIATRIC: The patient is more confused.  SKIN: No obvious rash, lesion, or ulcer.   Physical Exam LABORATORY PANEL:   CBC Recent Labs  Lab 01/15/17 0517  WBC 3.7*  HGB 9.9*  HCT 29.3*  PLT 68*   ------------------------------------------------------------------------------------------------------------------  Chemistries  Recent Labs  Lab 01/11/17 2046  01/15/17 0517 01/15/17 1204  NA  --    < > 135  --   K  --    < > 5.5* 5.7*  CL  --    < > 102  --   CO2  --    < > 26  --   GLUCOSE  --    < > 118*  --   BUN  --    < > 12  --   CREATININE  --    < > 0.92  --   CALCIUM  --    < > 8.9  --   MG  --    < > 1.9  --   AST 97*  --   --   --   ALT 25  --   --   --   ALKPHOS 99  --   --   --   BILITOT 1.4*  --   --   --    < > = values in this interval not displayed.   ------------------------------------------------------------------------------------------------------------------  Cardiac Enzymes Recent Labs  Lab 01/11/17 1820  TROPONINI 0.04*   ------------------------------------------------------------------------------------------------------------------  RADIOLOGY:  No results found.  ASSESSMENT AND PLAN:   Active Problems:   New onset a-fib (HCC)   Protein-calorie malnutrition, severe  *  New onset atrial fibrillation with RVR,   initially was on Cardizem drip.    Converted to normal sinus rhythm. Switch to oral as per recommendation by cardiology.   Echocardiogram.   As this happened during an episode of infection and patient has finding of small aortic aneurysm, he is not a candidate for long-term anticoagulation.   EF noted to be low on Echo- change meds to metoprolol and  lisinopril.  Advise to follow in cardio clinic.    *  Lactic acidosis   monitor for mesenteric ischemia.    sepsis,  due to colitis.   Given IV fluids.  *  Acute colitis, per abd CT started on IV antibiotics.  Patient has abdominal pain but he denies nausea vomiting or diarrhea.  We will continue to monitor clinically closely.   Start on diet today as no pain.   He was on IV vancomycin and Zosyn, but I changed to Cipro plus Flagyl because of colitis.  *  Small infrarenal aortic dissection.  This was discussed with vascular surgery and the recommendation was for medical management, keep the blood pressure well controlled. Avoid anticoagulation.  * Thrombocytopenia   Stopped heparin subcutaneous, continue monitoring.  * Hypokalemia, hypomagnesemia   Replace, recheck in the morning.   Now Hyperkalemia, stopped supplement, kayexalate.  * hyperkalemia'   Kayexalate, recheck.  * Altered mental status   Patient is on his CIWA protocol, and due to agitation early morning received oral and IV Ativan. Likely this is the cause for him being lethargic   * malnutrition   Dietary consult.  Also suspected of him having a stroke yesterday and was planning to do a CT head but later on he was completely alert and oriented and was able to move all limbs so I DC'd CT head.  * Severe malnutrition   Electrolyte imbalance   With a dietitian consult once he is more alert, pharmacy to help replacing electrolytes including magnesium and phosphorus.  records are reviewed and case discussed with Care Management/Social Workerr. Management plans discussed with the patient, family and they are in agreement.  CODE STATUS: Full code.  TOTAL TIME TAKING CARE OF THIS PATIENT35  minutes.   Need help by case manager as his living conditions are poor as per friend. liekly d./c tomorrow.  POSSIBLE D/C1-2DAYS, DEPENDING ON CLINICAL CONDITION.   Altamese Dilling M.D on 01/15/2017   Between 7am to 6pm -  Pager - [phone]  After 6pm go to [email or web address] - password [email or web address]  [facility] [place] [facility]  Office  [phone]  CC: Primary care physician; [place], [place] [facility]  Note: This dictation was prepared with Dragon dictation along with smaller phrase technology. Any transcriptional errors that result from this process are unintentional.

## 2017-01-15 NOTE — Plan of Care (Signed)
  Not Progressing Safety: Ability to remain free from injury will improve 01/15/2017 2254 - Not Progressing by Stefan Churchogers, Derriana Oser M, RN

## 2017-01-15 NOTE — Progress Notes (Signed)
Pt still trying to get out of bed and "go home." MD Pyreddy made aware. One time dose 2 mg haldol given.

## 2017-01-15 NOTE — Progress Notes (Signed)
Pharmacy consulted for electrolyte replacement protocol:   Goal of therapy: Electrolytes within normal limits:  K 3.5 - 5.1 Corrected Ca 8.9 - 10.3 Phos 2.5 - 4.6 Mg 1.7 - 2.4   Assessment: Lab Results  Component Value Date   CREATININE 0.92 01/15/2017   BUN 12 01/15/2017   NA 135 01/15/2017   K 5.5 (H) 01/15/2017   CL 102 01/15/2017   CO2 26 01/15/2017  Phos 4.3 Mg 1.9   Plan: K now elevated at 5.5. I will stop KCL supplementation. Will recheck at 1800 to make sure level is decreasing.   Olene FlossMelissa D Kaniah Rizzolo, Pharm.D, BCPS Clinical Pharmacist

## 2017-01-15 NOTE — Plan of Care (Signed)
Patient tired this shift. Arouses when spoken to, but remains disoriented. Poor appetite this shift.

## 2017-01-15 NOTE — Progress Notes (Signed)
PT Cancellation Note  Patient Details Name: Jesus Hardy MRN: [id number] DOB: 11/17/1940   Cancelled Treatment:    Reason Eval/Treat Not Completed: Patient not medically ready.  PT consult received.  Chart reviewed.  Pt's potassium noted to be elevated this morning at 5.5.  Per PT guidelines for elevated potassium, will hold PT at this time and re-attempt PT evaluation at a later date/time as medically appropriate.  Hendricks LimesEmily Berkley Wrightsman, PT 01/15/17, 8:21 AM [phone]
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
## 2017-01-15 NOTE — Progress Notes (Signed)
Dr. Elisabeth PigeonVachhani notified of K 5.7. Order for another one time dose of 15g kayexalate PO. Will keep patient overnight and follow labs in the morning.

## 2017-01-16 LAB — BASIC METABOLIC PANEL
Anion gap: 10 (ref 5–15)
BUN: 11 mg/dL (ref 6–20)
CALCIUM: 9.1 mg/dL (ref 8.9–10.3)
CO2: 24 mmol/L (ref 22–32)
CREATININE: 0.89 mg/dL (ref 0.61–1.24)
Chloride: 102 mmol/L (ref 101–111)
GFR calc non Af Amer: >60 mL/min (ref >60)
GLUCOSE: 107 mg/dL — AB (ref 65–99)
POTASSIUM: 5.4 mmol/L — AB (ref 3.5–5.1)
Sodium: 136 mmol/L (ref 135–145)

## 2017-01-16 LAB — CBC
HEMATOCRIT: 33.5 % — AB (ref 40.0–52.0)
Hemoglobin: 11.1 g/dL — ABNORMAL LOW (ref 13.0–18.0)
MCH: 34.1 pg — ABNORMAL HIGH (ref 26.0–34.0)
MCHC: 33.2 g/dL (ref 32.0–36.0)
MCV: 102.6 fL — AB (ref 80.0–100.0)
PLATELETS: 88 10*3/uL — AB (ref 150–440)
RBC: 3.27 MIL/uL — ABNORMAL LOW (ref 4.40–5.90)
RDW: 18.2 % — ABNORMAL HIGH (ref 11.5–14.5)
WBC: 6 10*3/uL (ref 3.8–10.6)

## 2017-01-16 LAB — CULTURE, BLOOD (ROUTINE X 2)
CULTURE: NO GROWTH
Culture: NO GROWTH
SPECIAL REQUESTS: ADEQUATE
SPECIAL REQUESTS: ADEQUATE

## 2017-01-16 LAB — GLUCOSE, CAPILLARY: GLUCOSE-CAPILLARY: 110 mg/dL — AB (ref 65–99)

## 2017-01-16 NOTE — Progress Notes (Signed)
[facility] - [place] [facility]   PATIENT NAME: Jesus Hardy    MR#:  [id number]  DATE OF BIRTH:  06/16/1940  SUBJECTIVE:  Patient answers basic questions appropriately.  Patient's sister and niece in the room.  He is a bit upset with family members  REVIEW OF SYSTEMS:   Review of Systems  Constitutional: Negative for chills, fever and weight loss.  HENT: Negative for ear discharge, ear pain and nosebleeds.   Eyes: Negative for blurred vision, pain and discharge.  Respiratory: Negative for sputum production, shortness of breath, wheezing and stridor.   Cardiovascular: Negative for chest pain, palpitations, orthopnea and PND.  Gastrointestinal: Negative for abdominal pain, diarrhea, nausea and vomiting.  Genitourinary: Negative for frequency and urgency.  Musculoskeletal: Negative for back pain and joint pain.  Neurological: Positive for weakness. Negative for sensory change, speech change and focal weakness.  Psychiatric/Behavioral: Negative for depression and hallucinations. The patient is not nervous/anxious.    Tolerating Diet:yes Tolerating PT: pending  DRUG ALLERGIES:  No Known Allergies  VITALS:  Blood pressure 110/60, pulse 79, temperature 98.5 F (36.9 C), temperature source Oral, resp. rate 18, height 6\' 3"  (1.905 m), weight 52.2 kg (115 lb), SpO2 98 %.  PHYSICAL EXAMINATION:   Physical Exam  GENERAL:  77 y.o.-year-old patient lying in the bed with no acute distress. Thin, cachectic, malnourished EYES: Pupils equal, round, reactive to light and accommodation. No scleral icterus. Extraocular muscles intact.  HEENT: Head atraumatic, normocephalic. Oropharynx and nasopharynx clear.  NECK:  Supple, no jugular venous distention. No thyroid enlargement, no tenderness.  LUNGS: Normal breath sounds bilaterally, no wheezing, rales, rhonchi. No use of accessory muscles of respiration.  CARDIOVASCULAR: S1, S2 normal. No murmurs, rubs, or gallops.   ABDOMEN: Soft, nontender, nondistended. Bowel sounds present. No organomegaly or mass.  EXTREMITIES: No cyanosis, clubbing or edema b/l.    NEUROLOGIC: Cranial nerves II through XII are intact. No focal Motor or sensory deficits b/l.   PSYCHIATRIC:  patient is alert and oriented x 3.  SKIN: No obvious rash, lesion, or ulcer.   LABORATORY PANEL:  CBC Recent Labs  Lab 01/16/17 0410  WBC 6.0  HGB 11.1*  HCT 33.5*  PLT 88*    Chemistries  Recent Labs  Lab 01/11/17 2046  01/15/17 0517  01/16/17 0410  NA  --    < > 135  --  136  K  --    < > 5.5*   < > 5.4*  CL  --    < > 102  --  102  CO2  --    < > 26  --  24  GLUCOSE  --    < > 118*  --  107*  BUN  --    < > 12  --  11  CREATININE  --    < > 0.92  --  0.89  CALCIUM  --    < > 8.9  --  9.1  MG  --    < > 1.9  --   --   AST 97*  --   --   --   --   ALT 25  --   --   --   --   ALKPHOS 99  --   --   --   --   BILITOT 1.4*  --   --   --   --    < > = values in this interval not displayed.  Cardiac Enzymes Recent Labs  Lab 01/11/17 1820  TROPONINI 0.04*   RADIOLOGY:  No results found. ASSESSMENT AND PLAN:  Jesus Hardy  is a 77 y.o. male with a known history of pedis and chronic pain.  patient was brought to the hospital by paramedics status post syncopal episode at a [employer].  Patient recalls having palpitations and falling down to the floor, he lost consciousness for a few seconds.    *New onset atrial fibrillation with RVR  initially was on Cardizem drip--now on po CCB   Converted to normal sinus rhythm. Switch to oral as per recommendation by cardiology.   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  Advise to follow in cardio clinic.    *Lactic acidosis due to mild colitis  *Acute colitis, per abd CT started on IV antibiotics.Patient has abdominal pain but he denies nausea vomiting or diarrhea.   Start on  diet today as no pain.  cont po  Cipro plus Flagyl for  colitis.  *Small infrarenal aortic dissection.This was discussed with vascular surgery and the recommendation was for medical management,keep the blood pressure well controlled. Avoid anticoagulation.  * Thrombocytopenia   Stopped heparin subcutaneous, continue monitoring.  * Hypokalemia, hypomagnesemia   Replace, recheck in the morning.   Now Hyperkalemia, stopped supplement, kayexalate. 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  Patient to work with physical therapy.  Discharge plan discussed with care manager and social worker.  Discussed with patient's sister Cherre Robins and niece were in the room. Patient lives in somebody's shed.  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 He needs a structured environment to live and  Child psychotherapist working on discharge planning  Case discussed with [facility] surgeon. Management plans discussed with the patient, family and they are in agreement.  CODE STATUS: full  DVT Prophylaxis: Scd  TOTAL TIME TAKING CARE OF THIS PATIENT: 30 minutes.  >50% time spent on counselling and coordination of care  POSSIBLE D/C IN 1-2 DAYS, DEPENDING ON CLINICAL CONDITION.  Note: This dictation was prepared with Dragon dictation along with smaller phrase technology. Any transcriptional errors that result from this process are unintentional.  Enedina Finner M.D on 01/16/2017 at 12:11 PM  Between 7am to 6pm - Pager - [phone]  After 6pm go to [email or web address] - Social research officer, government  [facility] [place] [facility]  Office  [phone]  CC: Primary care physician; [place], [place] CarePatient ID: Jesus Hardy, male   DOB: [birth year], 77 y.o.   MRN: [id number]

## 2017-01-16 NOTE — [place] FL2 (Signed)
[place] MEDICAID FL2 LEVEL OF CARE SCREENING TOOL     IDENTIFICATION  Patient Name: Jesus Hardy Birthdate: 22-Mar-1940 Sex: male Admission Date (Current Location): 01/11/2017  [place] and [place] Number:  [address] [id number] Q Facility and Address:  [facility], [address] [place], [place] [address]      Provider Number: [id number]  Attending Physician Name and Address:  Enedina Finner, MD  Relative Name and Phone Number:  Lolita Cram [phone] or Granvil, Djordjevic Niece [phone] or Gerri Lins Sister [phone] or Tatum,Cheryl Friend [phone] or Lucius Conn [phone]     Current Level of Care: Hospital Recommended Level of Care: Skilled Nursing Facility Prior Approval Number:    Date Approved/Denied:   PASRR Number: [id number]  Discharge Plan: SNF    Current Diagnoses: Patient Active Problem List   Diagnosis Date Noted  . Protein-calorie malnutrition, severe 01/13/2017  . New onset a-fib (HCC) 01/12/2017    Orientation RESPIRATION BLADDER Height & Weight     Self, Time, Place, Situation  Normal Incontinent Weight: 115 lb (52.2 kg) Height:  6\' 3"  (190.5 cm)  BEHAVIORAL SYMPTOMS/MOOD NEUROLOGICAL BOWEL NUTRITION STATUS      Incontinent Diet(Carb modified)  AMBULATORY STATUS COMMUNICATION OF NEEDS Skin   Limited Assist Verbally Surgical wounds                       Personal Care Assistance Level of Assistance  Bathing, Dressing, Feeding Bathing Assistance: Limited assistance Feeding assistance: Independent Dressing Assistance: Limited assistance     Functional Limitations Info  Sight, Hearing, Speech Sight Info: Adequate Hearing Info: Adequate Speech Info: Adequate    SPECIAL CARE FACTORS FREQUENCY  PT (By licensed PT)     PT Frequency: 5x a week              Contractures Contractures Info: Not present    Additional Factors Info  Code Status, Allergies Code Status Info: Full  Code Allergies Info: NKA           Current Medications (01/16/2017):  This is the current hospital active medication list Current Facility-Administered Medications  Medication Dose Route Frequency Provider Last Rate Last Dose  . acetaminophen (TYLENOL) tablet 650 mg  650 mg Oral Q6H PRN Cammy Copa, MD       Or  . acetaminophen (TYLENOL) suppository 650 mg  650 mg Rectal Q6H PRN Cammy Copa, MD      . bisacodyl (DULCOLAX) EC tablet 5 mg  5 mg Oral Daily PRN Cammy Copa, MD      . calcium carbonate (TUMS - dosed in mg elemental calcium) chewable tablet 200 mg of elemental calcium  1 tablet Oral QID PRN Altamese Dilling, MD   200 mg of elemental calcium at 01/13/17 0024  . ciprofloxacin (CIPRO) tablet 500 mg  500 mg Oral BID Altamese Dilling, MD   500 mg at 01/16/17 0849  . docusate sodium (COLACE) capsule 100 mg  100 mg Oral BID Cammy Copa, MD   100 mg at 01/16/17 0946  . feeding supplement (ENSURE ENLIVE) (ENSURE ENLIVE) liquid 237 mL  237 mL Oral TID BM Altamese Dilling, MD   237 mL at 01/15/17 2119  . folic acid (FOLVITE) tablet 1 mg  1 mg Oral Daily Pyreddy, Vivien Rota, MD   1 mg at 01/16/17 0946  . HYDROcodone-acetaminophen (NORCO/VICODIN) 5-325 MG per tablet 1-2 tablet  1-2 tablet Oral Q4H PRN Cammy Copa, MD   1 tablet at 01/14/17 1058  . lisinopril (  PRINIVIL,ZESTRIL) tablet 5 mg  5 mg Oral Daily Altamese DillingVachhani, Vaibhavkumar, MD   5 mg at 01/16/17 0946  . LORazepam (ATIVAN) injection 1 mg  1 mg Intravenous Q4H PRN Altamese DillingVachhani, Vaibhavkumar, MD   1 mg at 01/15/17 2119  . methocarbamol (ROBAXIN) tablet 1,500 mg  1,500 mg Oral QID Cammy CopaMaier, Angela, MD   1,500 mg at 01/16/17 1313  . metoprolol tartrate (LOPRESSOR) tablet 25 mg  25 mg Oral BID Altamese DillingVachhani, Vaibhavkumar, MD   25 mg at 01/16/17 0946  . metroNIDAZOLE (FLAGYL) tablet 500 mg  500 mg Oral Q8H Altamese DillingVachhani, Vaibhavkumar, MD   500 mg at 01/16/17 1313  . multivitamin with minerals tablet 1 tablet  1 tablet Oral Daily Ihor AustinPyreddy,  Pavan, MD   1 tablet at 01/16/17 0946  . ondansetron (ZOFRAN) tablet 4 mg  4 mg Oral Q6H PRN Cammy CopaMaier, Angela, MD       Or  . ondansetron [facility](ZOFRAN) injection 4 mg  4 mg Intravenous Q6H PRN Cammy CopaMaier, Angela, MD      . thiamine (VITAMIN B-1) tablet 100 mg  100 mg Oral Daily Pyreddy, Pavan, MD   100 mg at 01/16/17 0946   Or  . thiamine (B-1) injection 100 mg  100 mg Intravenous Daily Pyreddy, Pavan, MD      . traZODone (DESYREL) tablet 25 mg  25 mg Oral QHS PRN Cammy CopaMaier, Angela, MD         Discharge Medications: Please see discharge summary for a list of discharge medications.  Relevant Imaging Results:  Relevant Lab Results:   Additional Information SSN [id number]  Darleene Cleavernterhaus, Emaley Applin R, [place]CSWA

## 2017-01-16 NOTE — Progress Notes (Signed)
PT Cancellation Note  Patient Details Name: Jesus GrinderWilliam N Cameron MRN: [id number] DOB: 03/20/1940   Cancelled Treatment:    Reason Eval/Treat Not Completed: Patient not medically ready.  Pt's potassium elevated to 5.4 this morning.  Per PT guidelines for elevated potassium, will hold PT at this time and re-attempt PT evaluation at a later date/time as medically appropriate.  Hendricks LimesEmily Jada Fass, PT 01/16/17, 7:57 AM [phone]

## 2017-01-16 NOTE — Evaluation (Signed)
Physical Therapy Evaluation Patient Details Name: Jesus Hardy MRN: [id number] DOB: 04/26/1940 Today's Date: 01/16/2017   History of Present Illness  Pt is a 77 y.o. male presenting to hospital with syncope at [employer].  CT angio abdomen pelvis showing small focal dissecting flap in infrarenal abdominal aorta without significant progression (an atypical dissection is not excluded).  Pt admitted with new onset a-fib with RVR, lactic acidosis, acute colitis, and small infrarenal aortic dissection.  PMH includes DM.  Clinical Impression  MD Allena Katz cleared PT to see pt.  Prior to hospital admission, pt appears to have been ambulatory (pt minimally conversational during session; appearing intermittently lethargic; but intermittently stating 1-2 words at a time). Currently pt is max assist supine to/from sit; close SBA to mod assist for sitting balance; and max assist to achieve 1/2 stand x2 trials.  During session pt's BP 138/82-136/72; HR 80-73 bpm, and O2 99- 98% (vitals listed at rest and then after activity).  Pt would benefit from skilled PT to address noted impairments and functional limitations (see below for any additional details).  Upon hospital discharge, recommend pt discharge to STR.    Follow Up Recommendations SNF    Equipment Recommendations  Rolling walker with 5" wheels    Recommendations for Other Services       Precautions / Restrictions Precautions Precautions: Fall Restrictions Weight Bearing Restrictions: No      Mobility  Bed Mobility Overal bed mobility: Needs Assistance Bed Mobility: Supine to Sit;Sit to Supine     Supine to sit: Max assist;HOB elevated Sit to supine: Max assist;HOB elevated   General bed mobility comments: assist for LE's and trunk supine to/from sit (2 assist to scoot pt up in bed)  Transfers Overall transfer level: Needs assistance Equipment used: None Transfers: Sit to/from Stand Sit to Stand: Max assist          General transfer comment: x2 trials with B knees blocked pt only able to perform 1/2 stand; vc's required for technique and therapist also needing to block pt's L foot from slipping forward  Ambulation/Gait             General Gait Details: not appropriate at this time (unable to stand pt fully with 1 assist)  Stairs            Wheelchair Mobility    Modified Rankin (Stroke Patients Only)       Balance Overall balance assessment: Needs assistance Sitting-balance support: Bilateral upper extremity supported;Feet supported Sitting balance-Leahy Scale: Poor Sitting balance - Comments: varying between close SBA to mod assist for balance (pt fatigued with time sitting on edge of bed)       Standing balance comment: unable to fully stand pt to assess                             Pertinent Vitals/Pain Pain Assessment: Faces Faces Pain Scale: Hurts a little bit(2/10 at rest; 6/10 with mobility) Pain Location: back pain Pain Descriptors / Indicators: Sore Pain Intervention(s): Limited activity within patient's tolerance;Monitored during session;Repositioned    Home Living                   Additional Comments: Pt did not answer any home living questions but per chart, pt lives in a garage with heat, TV, and microwave.    Prior Function           Comments: Pt did not answer  any prior level of function questions but per chart pt was independent with ADL's.     Hand Dominance        Extremity/Trunk Assessment   Upper Extremity Assessment Upper Extremity Assessment: Generalized weakness    Lower Extremity Assessment Lower Extremity Assessment: Generalized weakness    Cervical / Trunk Assessment Cervical / Trunk Assessment: Normal  Communication   Communication: (Pt minimally conversational during session (did say 1-2 word statements intermittently))  Cognition Arousal/Alertness: Lethargic Behavior During Therapy: Flat affect Overall  Cognitive Status: No family/caregiver present to determine baseline cognitive functioning(Oriented to name (pt did not answer any other questions to determine cognitive status))                                        General Comments General comments (skin integrity, edema, etc.): Pt resting in bed upon PT arrival; foley bag noted hooked to edge of bed but tubing not connected to anything or pt (nursing notified).  Vascular surgery recommending medical management with strict BP control (d/t aortic dissection).  MD Allena KatzPatel called PT this morning requesting therapist to see pt; PT discussed concerns of elevated potassium and aortic dissection with MD and MD cleared PT to work with pt (monitoring vitals).    Exercises  Bed mobility and transfer training   Assessment/Plan    PT Assessment Patient needs continued PT services  PT Problem List Decreased strength;Decreased activity tolerance;Decreased balance;Decreased mobility;Decreased knowledge of use of DME;Decreased knowledge of precautions;Pain       PT Treatment Interventions DME instruction;Gait training;Stair training;Functional mobility training;Therapeutic activities;Therapeutic exercise;Balance training;Patient/family education    PT Goals (Current goals can be found in the Care Plan section)  Acute Rehab PT Goals Patient Stated Goal: to be able to walk again PT Goal Formulation: With patient Time For Goal Achievement: 01/30/17 Potential to Achieve Goals: Fair    Frequency Min 2X/week   Barriers to discharge Decreased caregiver support      Co-evaluation               AM-PAC PT "6 Clicks" Daily Activity  Outcome Measure Difficulty turning over in bed (including adjusting bedclothes, sheets and blankets)?: Unable Difficulty moving from lying on back to sitting on the side of the bed? : Unable Difficulty sitting down on and standing up from a chair with arms (e.g., wheelchair, bedside commode, etc,.)?:  Unable Help needed moving to and from a bed to chair (including a wheelchair)?: Total Help needed walking in hospital room?: Total Help needed climbing 3-5 steps with a railing? : Total 6 Click Score: 6    End of Session Equipment Utilized During Treatment: Gait belt Activity Tolerance: Patient limited by fatigue;Patient limited by lethargy Patient left: in bed;with call bell/phone within reach;with bed alarm set Nurse Communication: Mobility status;Precautions PT Visit Diagnosis: Other abnormalities of gait and mobility (R26.89);Muscle weakness (generalized) (M62.81)    Time: 1200-1230 PT Time Calculation (min) (ACUTE ONLY): 30 min   Charges:   PT Evaluation $PT Eval Low Complexity: 1 Low PT Treatments $Therapeutic Activity: 8-22 mins   PT G Codes:   PT G-Codes **NOT FOR INPATIENT CLASS** Functional Assessment Tool Used: AM-PAC 6 Clicks Basic Mobility Functional Limitation: Mobility: Walking and moving around Mobility: Walking and Moving Around Current Status (W0981(G8978): 100 percent impaired, limited or restricted Mobility: Walking and Moving Around Goal Status (X9147(G8979): 0 percent impaired, limited or restricted  Hendricks Limes, PT 01/16/17, 12:56 PM [phone]

## 2017-01-16 NOTE — Progress Notes (Signed)
Wound care to R great toe completed at this time for the shift, as ordered. Will continue to monitor. Jesus FavreSteven M [facility]

## 2017-01-16 NOTE — Clinical Social Work Note (Addendum)
CSW received consult that [facility]  CSW attempted to contact patient's sister Fannie KneeSue, and niece Misty StanleyLisa, left a message on Lisa's voice mail, and Sue's voice mail has not been set up.  CSW awaiting for call back, patient does have some bed offers, just need to speak with patient's family for a decision.  CSW to continue to follow patient's progress throughout discharge planning.  6:20pm  CSW received phone call from patient's niece Misty StanleyLisa, CSW presented bed offers and they chose [facility], CSW contacted [facility] to begin insurance authorization.  Patient's niece asked if patient is able to make his own decisions and chooses not to go to SNF what can be done, CSW informed her that Adult Protective Services can be contacted and they can decide if they need to investigate.  CSW to continue to follow patient's progress throughout discharge planning.  Ervin KnackEric R. Wilmore Holsomback, MSW, Theresia MajorsLCSWA [phone]  01/16/2017 6:11 PM

## 2017-01-16 NOTE — Care Management (Signed)
Patient's sister Cherre RobinsSue Baker and niece Lara MulchLisa Schafer at bedside.  Patient not as confused as yesterday.  All confirm patient live in a "shed" not a garage but his friend tells sister patient does have electricity, heat and tv.  He says he uses a Optometrist"tractor" (the Therapist, [employer]sl[employer] mover) to "go down the road to the store".  Fannie KneeSue says that "the people are not letting him go back to their place." Fannie KneeSue and Misty StanleyLisa are in agreement that patient needs placement.  CM discussed that physical therapy consult is pending.  Disposition will be based on physical therapy recommendation.  Also discussed the need for supported living environment long term.  Fannie KneeSue says patient has medicaid that possibly may cover assisted living environment.  UPdated CSW

## 2017-01-16 NOTE — Plan of Care (Signed)
Patient has no cute event overnight. VSS, NSR and denied being in pain or any discomfort. Maintenance IV infusing.    Progressing Education: Knowledge of General Education information will improve 01/16/2017 0630 - Progressing by Kerrie BuffaloBakare, Lisandro Meggett Muili, RN Health Behavior/Discharge Planning: Ability to manage health-related needs will improve 01/16/2017 0630 - Progressing by Kerrie BuffaloBakare, Mattye Verdone Muili, RN Clinical Measurements: Ability to maintain clinical measurements within normal limits will improve 01/16/2017 0630 - Progressing by Kerrie BuffaloBakare, Cyanna Neace Muili, RN Will remain free from infection 01/16/2017 0630 - Progressing by Kerrie BuffaloBakare, Ingeborg Fite Muili, RN Diagnostic test results will improve 01/16/2017 0630 - Progressing by Kerrie BuffaloBakare, Marquay Kruse Muili, RN Respiratory complications will improve 01/16/2017 0630 - Progressing by Kerrie BuffaloBakare, Hildy Nicholl Muili, RN Cardiovascular complication will be avoided 01/16/2017 0630 - Progressing by Kerrie BuffaloBakare, Shantese Raven Muili, RN Activity: Risk for activity intolerance will decrease 01/16/2017 0630 - Progressing by Kerrie BuffaloBakare, Lucendia Leard Muili, RN Nutrition: Adequate nutrition will be maintained 01/16/2017 0630 - Progressing by Kerrie BuffaloBakare, Jevaun Strick Muili, RN Coping: Level of anxiety will decrease 01/16/2017 0630 - Progressing by Kerrie BuffaloBakare, Fumiko Cham Muili, RN Elimination: Will not experience complications related to bowel motility 01/16/2017 0630 - Progressing by Kerrie BuffaloBakare, Luismario Coston Muili, RN Will not experience complications related to urinary retention 01/16/2017 0630 - Progressing by Kerrie BuffaloBakare, Dillie Burandt Muili, RN Pain Managment: General experience of comfort will improve 01/16/2017 0630 - Progressing by Kerrie BuffaloBakare, Tymier Lindholm Muili, RN Safety: Ability to remain free from injury will improve 01/16/2017 0630 - Progressing by Kerrie BuffaloBakare, Julyanna Scholle Muili, RN Skin Integrity: Risk for impaired skin integrity will decrease 01/16/2017 0630 - Progressing by Kerrie BuffaloBakare, Haunani Dickard Muili, RN Education: Knowledge of disease or  condition will improve 01/16/2017 0630 - Progressing by Kerrie BuffaloBakare, Tamisha Nordstrom Muili, RN Understanding of medication regimen will improve 01/16/2017 0630 - Progressing by Kerrie BuffaloBakare, Adalind Weitz Muili, RN Activity: Ability to tolerate increased activity will improve 01/16/2017 0630 - Progressing by Kerrie BuffaloBakare, Gabriele Loveland Muili, RN Cardiac: Ability to achieve and maintain adequate cardiopulmonary perfusion will improve 01/16/2017 0630 - Progressing by Kerrie BuffaloBakare, Abimbola Aki Muili, RN Health Behavior/Discharge Planning: Ability to safely manage health-related needs after discharge will improve 01/16/2017 0630 - Progressing by Kerrie BuffaloBakare, Grisel Blumenstock Muili, RN

## 2017-01-16 NOTE — Plan of Care (Signed)
  Not Progressing Education: Knowledge of General Education information will improve 01/16/2017 2059 - Not Progressing by Kalman JewelsBallentine, Isys Tietje, RN Health Behavior/Discharge Planning: Ability to manage health-related needs will improve 01/16/2017 2059 - Not Progressing by Kalman JewelsBallentine, Dakai Braithwaite, RN Education: Knowledge of disease or condition will improve 01/16/2017 2059 - Not Progressing by Kalman JewelsBallentine, Jestine Bicknell, RN Health Behavior/Discharge Planning: Ability to safely manage health-related needs after discharge will improve 01/16/2017 2059 - Not Progressing by Kalman JewelsBallentine, Tomeshia Pizzi, RN  Patient confused and unable to perform ADL's independently.

## 2017-01-16 NOTE — Progress Notes (Signed)
CIWA not completed at this time. Patient sleeping peacefully in bed. Clearly not agitated or anxious at the moment. Will re-assess later in the shift. Jesus Hardy

## 2017-01-17 ENCOUNTER — Other Ambulatory Visit: Payer: Self-pay

## 2017-01-17 LAB — GLUCOSE, CAPILLARY: GLUCOSE-CAPILLARY: 91 mg/dL (ref 65–99)

## 2017-01-17 MED ORDER — FOLIC ACID 1 MG PO TABS: 1.0000 mg | ORAL_TABLET | Freq: Every day | ORAL | 0 refills | Status: AC

## 2017-01-17 MED ORDER — ENSURE ENLIVE PO LIQD: 237.0000 mL | Freq: Three times a day (TID) | ORAL | 12 refills | Status: AC

## 2017-01-17 MED ORDER — ADULT MULTIVITAMIN W/MINERALS CH: 1.0000 | ORAL_TABLET | Freq: Every day | ORAL | 0 refills | Status: AC

## 2017-01-17 MED ORDER — METOPROLOL TARTRATE 25 MG PO TABS: 25.0000 mg | ORAL_TABLET | Freq: Two times a day (BID) | ORAL | 0 refills | Status: AC

## 2017-01-17 MED ORDER — THIAMINE HCL 50 MG PO TABS: 100.0000 mg | ORAL_TABLET | Freq: Every day | ORAL | 0 refills | Status: AC

## 2017-01-17 MED ORDER — SODIUM CHLORIDE 0.9% FLUSH
3.0000 mL | Freq: Two times a day (BID) | INTRAVENOUS | Status: DC
  Administered 2017-01-17: 3 mL via INTRAVENOUS

## 2017-01-17 MED ORDER — CALCIUM CARBONATE ANTACID 500 MG PO CHEW: 1.0000 | CHEWABLE_TABLET | Freq: Four times a day (QID) | ORAL | 0 refills | Status: AC | PRN

## 2017-01-17 MED ORDER — LISINOPRIL 5 MG PO TABS: 5.0000 mg | ORAL_TABLET | Freq: Every day | ORAL | 0 refills | Status: AC

## 2017-01-17 NOTE — Care Management Important Message (Signed)
Important Message  Patient Details  Name: Jesus Hardy MRN: [id number] Date of Birth: 05/05/1940   Medicare Important Message Given:  Yes Gave notice to patient's sister Faith RogueSue Baker   Jennye Runquist R, RN 01/17/2017, 4:40 PM

## 2017-01-17 NOTE — Progress Notes (Signed)
VSS, d/c telemetry and d/c piv.    Called report to [facility]   EMS to transport

## 2017-01-17 NOTE — Clinical Social Work Note (Signed)
Patient to be d/c'ed today to [facility].  Patient and family agreeable to plans will transport via ems RN to call report to room 607, [phone].  CSW notified patient's sister Fannie KneeSue.  Windell MouldingEric Parneet Glantz, MSW, Theresia MajorsLCSWA [phone]

## 2017-01-17 NOTE — Discharge Summary (Signed)
[facility] [place] [facility]   PATIENT NAME: Jesus Hardy    MR#:  [id number]  DATE OF BIRTH:  20-Apr-1940  DATE OF ADMISSION:  01/11/2017 ADMITTING PHYSICIAN: Cammy Copa, MD  DATE OF DISCHARGE: 01/17/2017  PRIMARY CARE PHYSICIAN: [place]    ADMISSION DIAGNOSIS:  Syncope and collapse [R55] Lactic acidosis [E87.2] Dissection of abdominal aorta (HCC) [I71.02] Atrial fibrillation with RVR (HCC) [I48.91]  DISCHARGE DIAGNOSIS:  Atrial fibrillation with RVR--new onset Acute colitis--patient completed antibiotic Infrarenal aortic dissection--- conservative management Thrombocytopenia--chronic Severe malnutrition History of alcohol abuse  SECONDARY DIAGNOSIS:   Past Medical History:  Diagnosis Date  . Diabetes mellitus     HOSPITAL COURSE:  WilliamBreezeis a77 y.o.malewith a known history of pedis and chronic pain. patient was brought to the hospital by paramedics status [facility] [employer] [employer] [facility].Patient recalls having palpitations and falling down to the floor,he lost consciousness for a few seconds.   *New onset atrial fibrillation with RVR initially wason Cardizem drip--now on po BB Converted to normal sinus rhythm. Switch to oral as per recommendation by cardiology. 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 Advise to follow in cardio clinic.  *Lactic acidosis due to mild colitis  *Acute colitis, per abd CT  -Patient has abdominal pain but he denies nausea vomiting or diarrhea. Start on diet today as no pain. Completed rx with Cipro plus Flagyl for  colitis.  *Small infrarenal aortic dissection.This was discussed with vascular surgery and the recommendation was for medical management,keep the blood pressure well controlled. Avoid  anticoagulation.  *Thrombocytopenia Stopped heparin subcutaneous, continue monitoring.  * Hypokalemia, hypomagnesemia Replace, recheck in the morning. Now Hyperkalemia, stopped supplement, kayexalate. 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  Overall at baseline.  Discussed with patient's sister Cherre Robins Will discharge to rehab today once insurance authorization is available.  CONSULTS OBTAINED:  Treatment Team:  Marcina Millard, MD  DRUG ALLERGIES:  No Known Allergies  DISCHARGE MEDICATIONS:   Allergies as of 01/17/2017   No Known Allergies     Medication List    TAKE these medications   calcium carbonate 500 MG chewable tablet Commonly known as:  TUMS - dosed in mg elemental calcium Chew 1 tablet (200 mg of elemental calcium total) by mouth 4 (four) times daily as needed for indigestion or heartburn.   feeding supplement (ENSURE ENLIVE) Liqd Take 237 mLs by mouth 3 (three) times daily between meals.   folic acid 1 MG tablet Commonly known as:  FOLVITE Take 1 tablet (1 mg total) by mouth daily. Start taking on:  01/18/2017   lisinopril 5 MG tablet Commonly known as:  PRINIVIL,ZESTRIL Take 1 tablet (5 mg total) by mouth daily. Start taking on:  01/18/2017   methocarbamol 750 MG tablet Commonly known as:  ROBAXIN-750 Take 2 tablets (1,500 mg total) by mouth 4 (four) times daily.   metoprolol tartrate 25 MG tablet Commonly known as:  LOPRESSOR Take 1 tablet (25 mg total) by mouth 2 (two) times daily.   multivitamin with minerals Tabs tablet Take 1 tablet by mouth daily. Start taking on:  01/18/2017   thiamine 50 MG tablet Take 2 tablets (100 mg total) by mouth daily. Start taking on:  01/18/2017       If  you experience worsening of your admission symptoms, develop shortness of breath, life threatening emergency, suicidal  or homicidal thoughts you must seek medical attention immediately by calling 911 or calling your MD immediately  if symptoms less severe.  You Must read complete instructions/literature along with all the possible adverse reactions/side effects for all the Medicines you take and that have been prescribed to you. Take any new Medicines after you have completely understood and accept all the possible adverse reactions/side effects.   Please note  You were cared for by a hospitalist during your hospital stay. If you have any questions about your discharge medications or the care you received while you were in the hospital after you are discharged, you can call the unit and asked to speak with the hospitalist on call if the hospitalist that took care of you is not available. Once you are discharged, your primary care physician will handle any further medical issues. Please note that NO REFILLS for any discharge medications will be authorized once you are discharged, as it is imperative that you return to your primary care physician (or establish a relationship with a primary care physician if you do not have one) for your aftercare needs so that they can reassess your need for medications and monitor your lab values. Today   SUBJECTIVE   No new issues Sister in the room  VITAL SIGNS:  Blood pressure (!) 148/68, pulse (!) 59, temperature 98.2 F (36.8 C), temperature source Oral, resp. rate 18, height 6\' 3"  (1.905 m), weight 52.2 kg (115 lb), SpO2 99 %.  I/O:    Intake/Output Summary (Last 24 hours) at 01/17/2017 1200 Last data filed at 01/16/2017 2013 Gross per 24 hour  Intake -  Output 0 ml  Net 0 ml    PHYSICAL EXAMINATION:  GENERAL:  77 y.o.-year-old patient lying in the bed with no acute distress. Thin,cachectic EYES: Pupils equal, round, reactive to light and accommodation. No scleral icterus. Extraocular muscles intact.  HEENT: Head atraumatic, normocephalic. Oropharynx and nasopharynx  clear.  NECK:  Supple, no jugular venous distention. No thyroid enlargement, no tenderness.  LUNGS: Normal breath sounds bilaterally, no wheezing, rales,rhonchi or crepitation. No use of accessory muscles of respiration.  CARDIOVASCULAR: S1, S2 normal. No murmurs, rubs, or gallops.  ABDOMEN: Soft, non-tender, non-distended. Bowel sounds present. No organomegaly or mass.  EXTREMITIES: No pedal edema, cyanosis, or clubbing.  NEUROLOGIC: Cranial nerves II through XII are intact. Moves all extremities well.Sensation intact. Gait not checked.  PSYCHIATRIC: patient is alert and awake SKIN: No obvious rash, lesion, or ulcer.   DATA REVIEW:   CBC  Recent Labs  Lab 01/16/17 0410  WBC 6.0  HGB 11.1*  HCT 33.5*  PLT 88*    Chemistries  Recent Labs  Lab 01/11/17 2046  01/15/17 0517  01/16/17 0410  NA  --    < > 135  --  136  K  --    < > 5.5*   < > 5.4*  CL  --    < > 102  --  102  CO2  --    < > 26  --  24  GLUCOSE  --    < > 118*  --  107*  BUN  --    < > 12  --  11  CREATININE  --    < > 0.92  --  0.89  CALCIUM  --    < > 8.9  --  9.1  MG  --    < >  1.9  --   --   AST 97*  --   --   --   --   ALT 25  --   --   --   --   ALKPHOS 99  --   --   --   --   BILITOT 1.4*  --   --   --   --    < > = values in this interval not displayed.    Microbiology Results   Recent Results (from the past 240 hour(s))  Blood culture (routine x 2)     Status: None   Collection Time: 01/11/17 11:34 PM  Result Value Ref Range Status   Specimen Description BLOOD LEFT ANTECUBITAL  Final   Special Requests   Final    BOTTLES DRAWN AEROBIC AND ANAEROBIC Blood Culture adequate volume   Culture   Final    NO GROWTH 5 DAYS Performed at [facility], [address] [place], [place] [address]    Report Status 01/16/2017 FINAL  Final  Blood culture (routine x 2)     Status: None   Collection Time: 01/11/17 11:35 PM  Result Value Ref Range Status   Specimen Description BLOOD BLOOD RIGHT  FOREARM  Final   Special Requests   Final    BOTTLES DRAWN AEROBIC AND ANAEROBIC Blood Culture adequate volume   Culture   Final    NO GROWTH 5 DAYS Performed at [facility], [address] [place], [place] [address]    Report Status 01/16/2017 FINAL  Final    RADIOLOGY:  No results found.   Management plans discussed with the patient, family and they are in agreement.  CODE STATUS:     Code Status Orders  (From admission, onward)        Start     Ordered   01/12/17 0226  Full code  Continuous     01/12/17 0225    Code Status History    Date Active Date Inactive Code Status Order ID Comments User Context   This patient has a current code status but no historical code status.      TOTAL TIME TAKING CARE OF THIS PATIENT: 40 minutes.    Enedina FinnerSona Meric Joye M.D on 01/17/2017 at 12:00 PM  Between 7am to 6pm - Pager - [phone] After 6pm go to [email or web address] - Social research officer, governmentpassword [email or web address]  [facility]  Office  [phone]  CC: Primary care physician; [place], [facility]

## 2017-01-17 NOTE — Clinical Social Work Placement (Signed)
   CLINICAL SOCIAL WORK PLACEMENT  NOTE  Date:  01/17/2017  Patient Details  Name: Jesus Hardy MRN: [id number] Date of Birth: 06/29/1940  Clinical Social Work is seeking post-discharge placement for this patient at the Skilled  Nursing Facility level of care (*CSW will initial, date and re-position this form in  chart as items are completed):  Yes   Patient/family provided with [facility] Clinical Social Work Department's list of facilities offering this level of care within the geographic area requested by the patient (or if unable, by the patient's family).  Yes   Patient/family informed of their freedom to choose among providers that offer the needed level of care, that participate in Medicare, Medicaid or managed care program needed by the patient, have an available bed and are willing to accept the patient.  Yes   Patient/family informed of [facility]'s ownership interest in [facility] and [facility], as well as of the fact that they are under no obligation to receive care at these facilities.  PASRR submitted to EDS on 01/15/17     PASRR number received on 01/15/17     Existing PASRR number confirmed on       FL2 transmitted to all facilities in geographic area requested by pt/family on 01/15/17     FL2 transmitted to all facilities within larger geographic area on       Patient informed that his/her managed care company has contracts with or will negotiate with certain facilities, including the following:        Yes   Patient/family informed of bed offers received.  Patient [facility] bed at [facility]     Physician recommends and patient chooses bed at      Patient to be transferred to [facility] on 01/17/17.  Patient to be [facility] to facility [facility]     Patient family notified on 01/17/17 of transfer.  Name of family member notified:  Fannie KneeSue patient's sister     PHYSICIAN Please sign FL2     Additional  Comment:    _______________________________________________ Darleene CleaverAnterhaus, Elasia Furnish R, LCSWA 01/17/2017, 3:30 PM

## 2017-01-17 NOTE — Clinical Social Work Note (Signed)
Clinical Social Work Assessment  Patient Details  Name: Jesus Hardy MRN: [id number] Date of Birth: 01/15/1940  Date of referral:  01/16/17               Reason for consult:  Facility Placement                Permission sought to share information with:  Family Supports, Magazine features editoracility Contact Representative Permission granted to share information::  Yes, Verbal Permission Granted  Name::     Lolita CramBaker, Sue Sister [phone] or Lara MulchBreeze, Lisa Niece [phone]   Agency::  SNF admissions  Relationship::     Contact Information:     Housing/Transportation Living arrangements for the past 2 months:  (Patient lives in a shed.) Source of Information:  Siblings, Other (Comment Required)(Patient's niece) Patient Interpreter Needed:  None Criminal Activity/Legal Involvement Pertinent to Current Situation/Hospitalization:  No - Comment as needed Significant Relationships:  Siblings Lives with:  Self Do you feel safe going back to the place where you live?  No Need for family participation in patient care:  Yes (Comment)  Care giving concerns:  Patient lives in a shed, with electricty, microwave, and TV.  Patient's family feels he needs SNF placement and possible ALF afterwards.  Patient is very confused and has some dementia.   Social Worker assessment / plan:  Patient is a 77 year old male who is alert and oriented x1.  Patient lives in a shed with electricty, microwave, and tv, patient is confused, assessment completed by speaking with patient's sister and niece.  Patient has been living in a shed on a friend's property, patient using a driving lawnmower to get to the store.  Patient expresses that he wants to return back home to his shed, but he is aware that he needs some rehab.  Patient's family is concerned that patient may not do well where he is living and may die while he is there.  Patient's family are in agreement to having patient go to SNF for short term rehab, CSW explained how insurance  will pay for stay and what to expect at SNF.  Patient's family gave CSW permission to begin bed [place] in [place].  Patient's family did not have any other questions or concerns.  Employment status:  Retired Database administratornsurance information:  Managed Medicare PT Recommendations:  Skilled Nursing Facility Information / Referral to community resources:  Skilled Nursing Facility  Patient/Family's Response to care:  Patient's family is in agreement to going to SNF for short term rehab.  Patient/Family's Understanding of and Emotional Response to Diagnosis, Current Treatment, and Prognosis:  Patient has some confusion, he did is hopeful that he can return back home.  Emotional Assessment Appearance:  Appears stated age Attitude/Demeanor/Rapport:    Affect (typically observed):  Appropriate, Calm Orientation:  Oriented to Self Alcohol / Substance use:  Not Applicable Psych involvement (Current and /or in the community):  No (Comment)  Discharge Needs  Concerns to be addressed:  Lack of Support Readmission within the last 30 days:  No Current discharge risk:  Cognitively Impaired, Lack of support system Barriers to Discharge:  Continued Medical Work up   [place] Const[employer]haus, Marilynne Dupuis R, LCSWA 01/17/2017, 3:17 PM

## 2017-06-28 ENCOUNTER — Emergency Department
Admission: EM | Admit: 2017-06-28 | Discharge: 2017-06-28 | Disposition: A | Discharge status: Home / Self Care | Payer: Medicare Other | Attending: Emergency Medicine | Admitting: Emergency Medicine

## 2017-06-28 ENCOUNTER — Encounter: Payer: Self-pay | Admitting: Emergency Medicine

## 2017-06-28 ENCOUNTER — Emergency Department: Payer: Medicare Other

## 2017-06-28 DIAGNOSIS — X58XXXA Exposure to other specified factors, initial encounter: Secondary | ICD-10-CM | POA: Diagnosis not present

## 2017-06-28 DIAGNOSIS — G8929 Other chronic pain: Secondary | ICD-10-CM | POA: Insufficient documentation

## 2017-06-28 DIAGNOSIS — Y939 Activity, unspecified: Secondary | ICD-10-CM | POA: Insufficient documentation

## 2017-06-28 DIAGNOSIS — Y929 Unspecified place or not applicable: Secondary | ICD-10-CM | POA: Diagnosis not present

## 2017-06-28 DIAGNOSIS — M545 Low back pain: Secondary | ICD-10-CM | POA: Diagnosis present

## 2017-06-28 DIAGNOSIS — S32000A Wedge compression fracture of unspecified lumbar vertebra, initial encounter for closed fracture: Secondary | ICD-10-CM | POA: Insufficient documentation

## 2017-06-28 DIAGNOSIS — E119 Type 2 diabetes mellitus without complications: Secondary | ICD-10-CM | POA: Insufficient documentation

## 2017-06-28 DIAGNOSIS — Y999 Unspecified external cause status: Secondary | ICD-10-CM | POA: Diagnosis not present

## 2017-06-28 DIAGNOSIS — W19XXXA Unspecified fall, initial encounter: Secondary | ICD-10-CM

## 2017-06-28 DIAGNOSIS — M4856XA Collapsed vertebra, not elsewhere classified, lumbar region, initial encounter for fracture: Secondary | ICD-10-CM

## 2017-06-28 LAB — CBC WITH DIFFERENTIAL/PLATELET
BASOS ABS: 0 10*3/uL (ref 0–0.1)
Basophils Relative: 0 %
Eosinophils Absolute: 0 10*3/uL (ref 0–0.7)
Eosinophils Relative: 0 %
HEMATOCRIT: 35.3 % — AB (ref 40.0–52.0)
Hemoglobin: 12 g/dL — ABNORMAL LOW (ref 13.0–18.0)
LYMPHS PCT: 33 %
Lymphs Abs: 1.6 10*3/uL (ref 1.0–3.6)
MCH: 31.3 pg (ref 26.0–34.0)
MCHC: 34.1 g/dL (ref 32.0–36.0)
MCV: 91.9 fL (ref 80.0–100.0)
MONO ABS: 0.6 10*3/uL (ref 0.2–1.0)
Monocytes Relative: 12 %
NEUTROS ABS: 2.7 10*3/uL (ref 1.4–6.5)
Neutrophils Relative %: 55 %
Platelets: 200 10*3/uL (ref 150–440)
RBC: 3.84 MIL/uL — ABNORMAL LOW (ref 4.40–5.90)
RDW: 13.9 % (ref 11.5–14.5)
WBC: 4.9 10*3/uL (ref 3.8–10.6)

## 2017-06-28 LAB — COMPREHENSIVE METABOLIC PANEL
ALT: 11 U/L — ABNORMAL LOW (ref 17–63)
AST: 24 U/L (ref 15–41)
Albumin: 3.4 g/dL — ABNORMAL LOW (ref 3.5–5.0)
Alkaline Phosphatase: 53 U/L (ref 38–126)
Anion gap: 8 (ref 5–15)
BILIRUBIN TOTAL: 0.6 mg/dL (ref 0.3–1.2)
BUN: 16 mg/dL (ref 6–20)
CALCIUM: 9.1 mg/dL (ref 8.9–10.3)
CO2: 27 mmol/L (ref 22–32)
CREATININE: 1.09 mg/dL (ref 0.61–1.24)
Chloride: 106 mmol/L (ref 101–111)
GFR calc Af Amer: >60 mL/min (ref >60)
Glucose, Bld: 103 mg/dL — ABNORMAL HIGH (ref 65–99)
POTASSIUM: 4 mmol/L (ref 3.5–5.1)
Sodium: 141 mmol/L (ref 135–145)
TOTAL PROTEIN: 7.1 g/dL (ref 6.5–8.1)

## 2017-06-28 LAB — TROPONIN I

## 2017-06-28 MED ORDER — OXYCODONE-ACETAMINOPHEN 5-325 MG PO TABS: 1.0000 | ORAL_TABLET | Freq: Three times a day (TID) | ORAL | 0 refills | Status: AC | PRN

## 2017-06-28 MED ORDER — SODIUM CHLORIDE 0.9 % IV SOLN
Freq: Once | INTRAVENOUS | Status: AC
  Administered 2017-06-28: 13:00:00 via INTRAVENOUS

## 2017-06-28 MED ORDER — OXYCODONE-ACETAMINOPHEN 5-325 MG PO TABS
1.0000 | ORAL_TABLET | Freq: Once | ORAL | Status: AC
  Administered 2017-06-28: 1 via ORAL
  Filled 2017-06-28: qty 1

## 2017-06-28 NOTE — ED Triage Notes (Signed)
PT arrived via ems after feeling like he was going to fall. Pt safely sat down and arrived a&o x 4.

## 2017-06-28 NOTE — ED Provider Notes (Signed)
[facility] Emergency Department Provider Note       Time seen: ----------------------------------------- 12:55 PM on 06/28/2017 -----------------------------------------   I have reviewed the triage vital signs and the nursing notes.  HISTORY   Chief Complaint Fall    HPI Jesus Hardy is a 77 y.o. male with a history of diabetes who presents to the ED for feeling like he was going to fall.  Patient states this happens from time to time when he has bad back pain.  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  He states he safely sat down and arrives alert and oriented.  He states his been drinking a lot of water, denies any recent illness or other complaints.  Past Medical History:  Diagnosis Date  . Diabetes mellitus     Patient Active Problem List   Diagnosis Date Noted  . Protein-calorie malnutrition, severe 01/13/2017  . New onset a-fib (HCC) 01/12/2017    History reviewed. No pertinent surgical history.  Allergies Patient has no known allergies.  Social History Social History   Tobacco Use  . Smoking status: Never Smoker  . Smokeless tobacco: Current User    Types: Chew  Substance Use Topics  . Alcohol use: Yes    Alcohol/week: 0.6 oz    Types: 1 Cans of beer per week  . Drug use: No   Review of Systems Constitutional: Negative for fever. Cardiovascular: Negative for chest pain. Respiratory: Negative for shortness of breath. Gastrointestinal: Negative for abdominal pain, vomiting and diarrhea. Musculoskeletal: Positive for back pain Skin: Negative for rash. Neurological: Negative for headaches, positive for generalized weakness  All systems negative/normal/unremarkable except as stated in the HPI  ____________________________________________   PHYSICAL EXAM:  VITAL SIGNS: ED Triage Vitals  Enc Vitals Group     BP 06/28/17 1252 (!) 145/90     Pulse Rate 06/28/17 1252 83     Resp  06/28/17 1252 16     Temp 06/28/17 1252 97.8 F (36.6 C)     Temp Source 06/28/17 1252 Oral     SpO2 06/28/17 1252 100 %     Weight 06/28/17 1253 115 lb (52.2 kg)     Height 06/28/17 1253 5\' 8"  (1.727 m)     Head Circumference --      Peak Flow --      Pain Score 06/28/17 1253 8     Pain Loc --      Pain Edu? --      Excl. in GC? --    Constitutional: Alert and oriented. Well appearing and in no distress. Eyes: Conjunctivae are normal. Normal extraocular movements. ENT   Head: Normocephalic and atraumatic.   Nose: No congestion/rhinnorhea.   Mouth/Throat: Mucous membranes are moist.   Neck: No stridor. Cardiovascular: Normal rate, regular rhythm. No murmurs, rubs, or gallops. Respiratory: Normal respiratory effort without tachypnea nor retractions. Breath sounds are clear and equal bilaterally. No wheezes/rales/rhonchi. Gastrointestinal: Soft and nontender. Normal bowel sounds Musculoskeletal: Nontender with normal range of motion in extremities. No lower extremity tenderness nor edema. Neurologic:  Normal speech and language. No gross focal neurologic deficits are appreciated.  Skin:  Skin is warm, dry and intact. No rash noted. Psychiatric: Mood and affect are normal. Speech and behavior are normal.  ____________________________________________  EKG: Interpreted by me.  Sinus rhythm the rate of 77 bpm, possible septal infarct age-indeterminate, normal QRS size, normal QT, normal axis  ____________________________________________  ED COURSE:  As part of my medical decision making, I reviewed the following data within the electronic MEDICAL RECORD NUMBER History obtained from family if available, nursing notes, old chart and ekg, as well as notes from prior ED visits. Patient presented for weakness, back pain, we will assess with labs and imaging as indicated at this time.   Procedures ____________________________________________   LABS (pertinent  positives/negatives)  Labs Reviewed  CBC WITH DIFFERENTIAL/PLATELET - Abnormal; Notable for the following components:      Result Value   RBC 3.84 (*)    Hemoglobin 12.0 (*)    HCT 35.3 (*)    All other components within normal limits  COMPREHENSIVE METABOLIC PANEL - Abnormal; Notable for the following components:   Glucose, Bld 103 (*)    Albumin 3.4 (*)    ALT 11 (*)    All other components within normal limits  TROPONIN I  URINALYSIS, COMPLETE (UACMP) WITH MICROSCOPIC    RADIOLOGY Images were viewed by me  Lumbar spine x-rays IMPRESSION: Progression of anterior wedging at L1 and L2 compared to prior study from 2016. Note that there is mild kyphosis at L1-2 currently, a finding not present on prior study. Milder wedging at L4 and L5 is stable compared to previous study. No spondylolisthesis. No appreciable disc space narrowing.  ____________________________________________  DIFFERENTIAL DIAGNOSIS   Dehydration, electrolyte abnormality, occult infection, chronic back pain  FINAL ASSESSMENT AND PLAN  Fall, chronic back pain, lumbar vertebral compression   Plan: The patient had presented after almost falling and feeling weak. Patient's labs did not reveal any acute process. Patient's imaging did reveal progression of chronic compression fracture and degenerative disc disease likely.  He would perhaps benefit from kyphoplasty.  Patient is currently feeling better, has no radicular symptoms or acute neurologic symptoms from his back pain or chronic appearing compression fracture.  He is stable for outpatient follow-up.   Ulice DashJohnathan E Williams, MD   Note: This note was generated in part or whole with voice recognition software. Voice recognition is usually quite accurate but there are transcription errors that can and very often do occur. I apologize for any typographical errors that were not detected and corrected.     Emily FilbertWilliams, Jonathan E, MD 06/28/17 [phone]

## 2019-04-16 IMAGING — CT CT CTA ABD/PEL W/CM AND/OR W/O CM
3 of 9 series · 9 of 46 positions shown, 15 images · IV contrast (iopamidol)
Comparison: 01/11/2017 at 3333 hours.

CLINICAL DATA: Syncope today. Atrial fibrillation. Abdominal pain.

EXAM:
CTA ABDOMEN AND PELVIS wITHOUT AND WITH CONTRAST
TECHNIQUE: Multidetector CT imaging of the abdomen and pelvis was performed
using the standard protocol during bolus administration of
intravenous contrast. Multiplanar reconstructed images and MIPs were
obtained and reviewed to evaluate the vascular anatomy.
CONTRAST:  100mL [id number] IOPAMIDOL (YU84LE-9CF) INJECTION 76%

[Series 4: axial arterial · axial · arterial · 0.63mm/px · z∈[-955,-907]mm · 2 of 219 slices shown]
[im 25/219  soft-tissue]
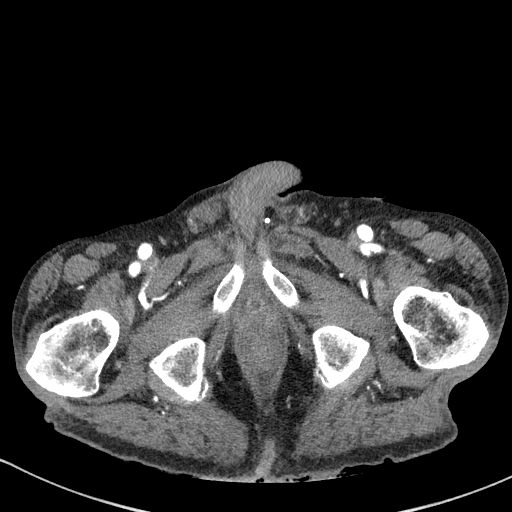
[im 49/219  soft-tissue]
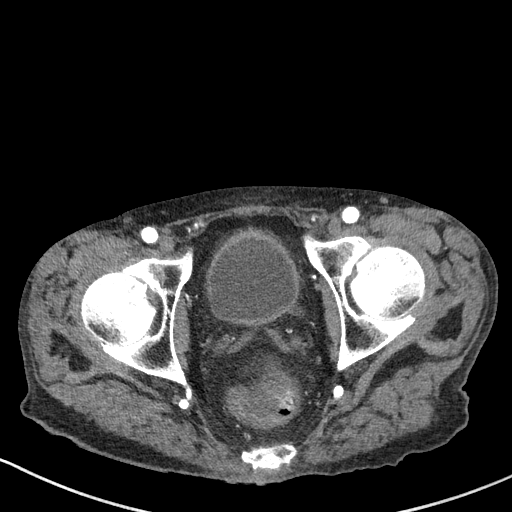

[Series 5: axial venous · axial · portal-venous · 0.63mm/px · z∈[-922,-642]mm · 5 of 86 slices shown, 10 images]
[im 15/86  soft-tissue]
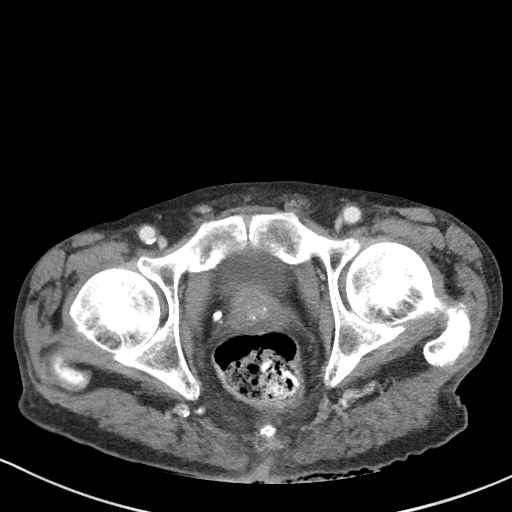
[im 15/86  bone]
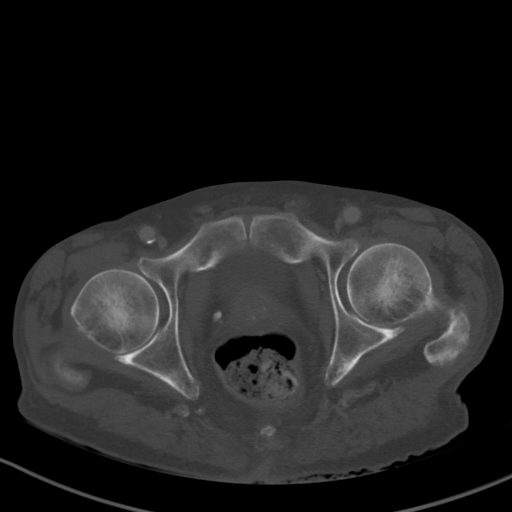
[im 29/86  soft-tissue]
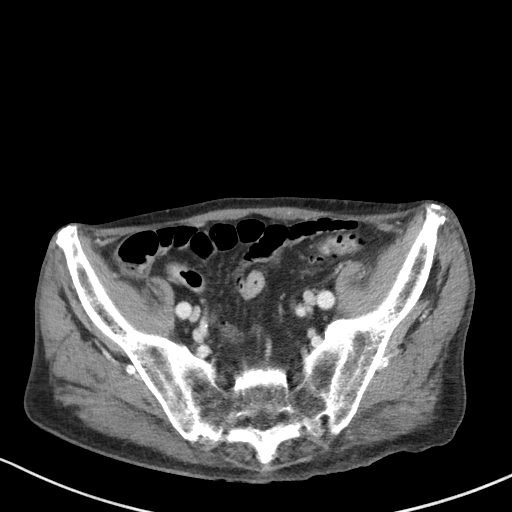
[im 29/86  lung]
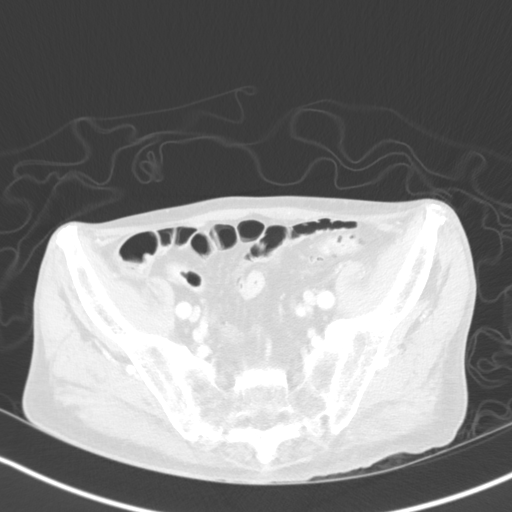
[im 43/86  soft-tissue]
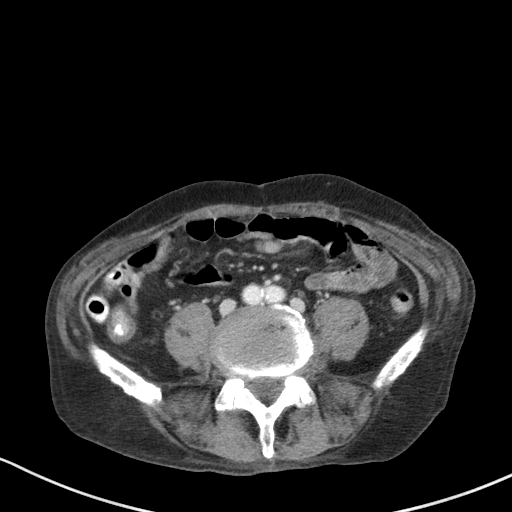
[im 43/86  lung]
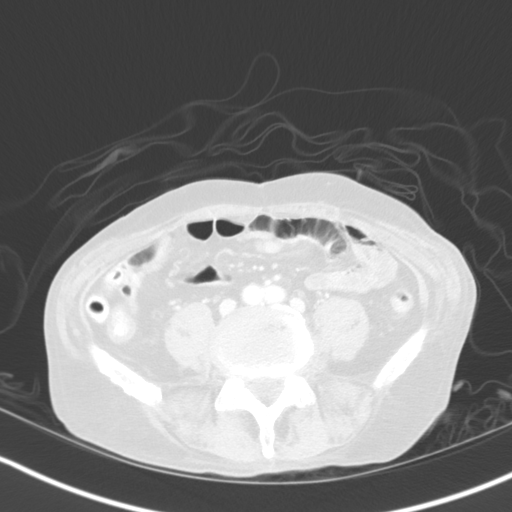
[im 57/86  soft-tissue]
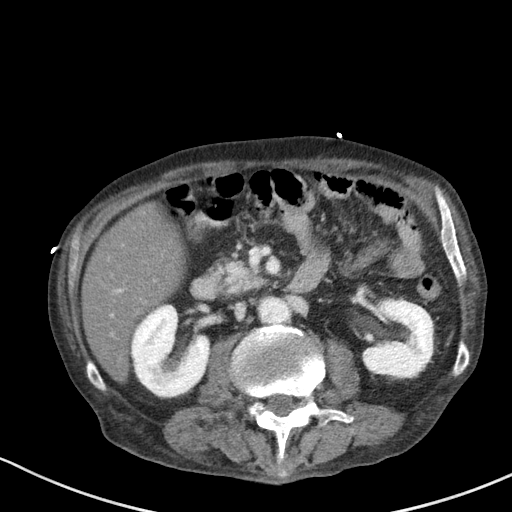
[im 57/86  lung]
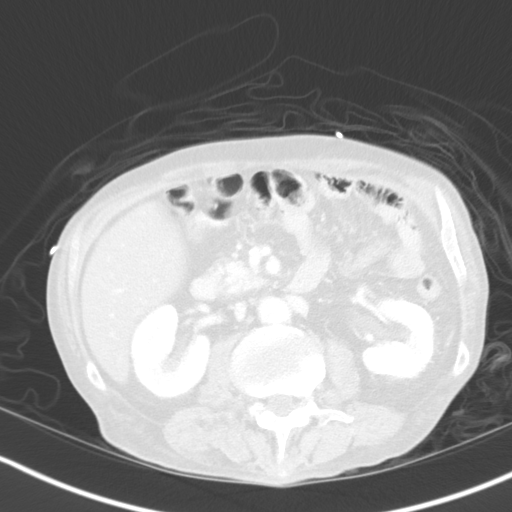
[im 71/86  soft-tissue]
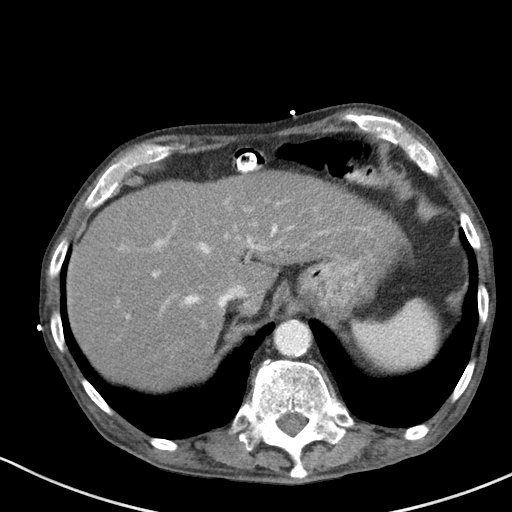
[im 71/86  lung]
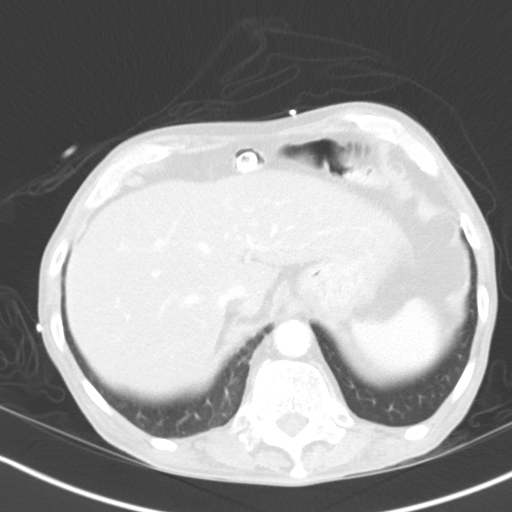

[Series 13: coronal venous mpr · coronal · portal-venous · 0.52mm/px · 2 of 114 slices shown, 3 images]
[im 38/114  soft-tissue]
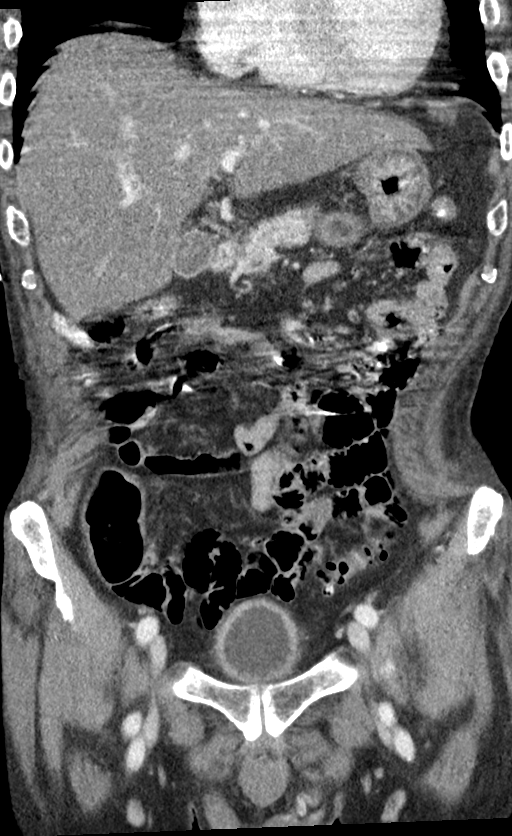
[im 38/114  bone]
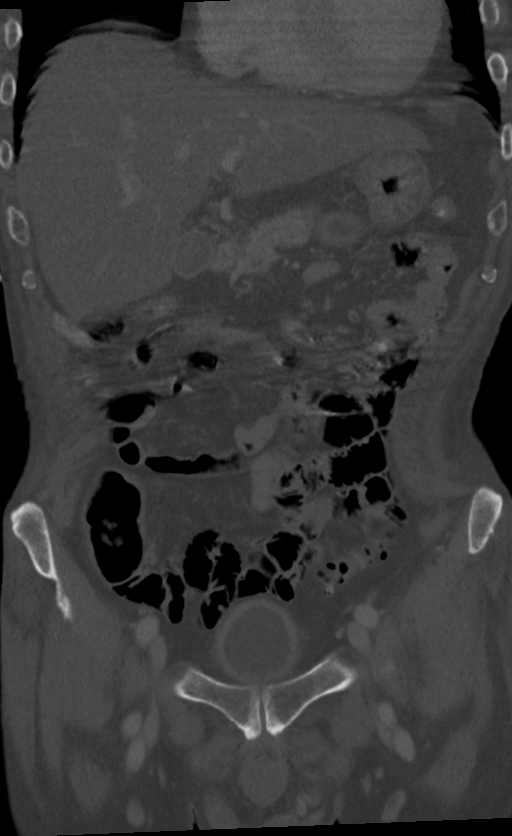
[im 76/114  soft-tissue]
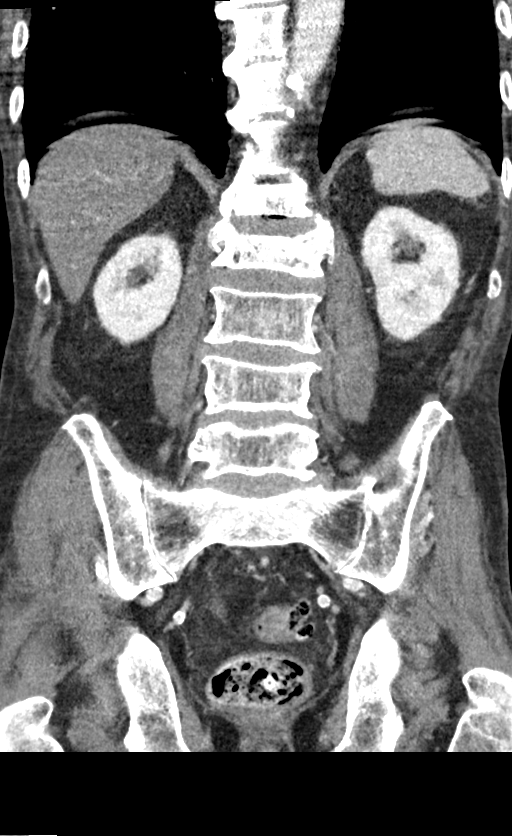

[9 of 46 positions shown; findings below may reference images not displayed]

FINDINGS: VASCULAR

Aorta: A focal dissecting flap is demonstrated in the infrarenal
abdominal aorta. This appears to be associated with the origin of a
lumbar artery. The flap does not definitively extend very far
superiorly or inferiorly. An atypical dissection is not excluded.
The aorta remains widely patent without aneurysm. Scattered aortic
calcifications.

Celiac: Patent without evidence of aneurysm, dissection, vasculitis
or significant stenosis.

SMA: Patent without evidence of aneurysm, dissection, vasculitis or
significant stenosis.

Renals: Both renal arteries are patent without evidence of aneurysm,
dissection, vasculitis, fibromuscular dysplasia or significant
stenosis.

IMA: Patent without evidence of aneurysm, dissection, vasculitis or
significant stenosis.

Inflow: Patent without evidence of aneurysm, dissection, vasculitis
or significant stenosis.

Proximal Outflow: Bilateral common femoral and visualized portions
of the superficial and profunda femoral arteries are patent without
evidence of aneurysm, dissection, vasculitis or significant
stenosis.

Veins: No obvious venous abnormality within the limitations of this
arterial phase study. Portal and mesenteric veins are patent.

Review of the MIP images confirms the above findings.

NON-VASCULAR

Lower chest: Mild dependent changes in the lung bases in addition to
respiratory motion artifact. Coronary artery calcifications. Small
esophageal hiatal hernia.

Hepatobiliary: Diffuse fatty infiltration of the liver. No focal
lesions identified. Layering sludge in the gallbladder. No discrete
stones or wall thickening identified. Bile ducts are not dilated.

Pancreas: Unremarkable. No pancreatic ductal dilatation or
surrounding inflammatory changes.

Spleen: Normal in size without focal abnormality.

Adrenals/Urinary Tract: No adrenal gland nodules. Kidneys are
symmetrical. Nephrograms are symmetrical. Mild prominence of the
renal collecting systems bilaterally but no stone or focal
obstruction is demonstrated. Bladder wall is mildly thickened which
may indicate cystitis.

Stomach/Bowel: Stomach, small bowel, and colon are decompressed,
limiting evaluation for wall thickening. There is contrast material
in the colon. Diffuse diverticulosis of the descending and sigmoid
colon. No evidence of diverticulitis. Appendix is normal.

Lymphatic: No significant lymphadenopathy.

Reproductive: Prostate is unremarkable.

Other: No abdominal wall hernia or abnormality. No abdominopelvic
ascites.

Musculoskeletal: Degenerative changes in the lumbar spine.
Compression deformities at multiple lumbar vertebrae as demonstrated
on the previous study from earlier today. Sclerosis in the L1 and L2
compression fractures may indicate recent fractures.
IMPRESSION: VASCULAR

A small focal dissecting flap is demonstrated in the infrarenal
abdominal aorta without significant progression. An atypical
dissection is not excluded. Aortic atherosclerosis.

NON-VASCULAR

1. Diffuse fatty infiltration of the liver.
2. Layering sludge in the gallbladder without obvious inflammatory
signs.
3. Mild prominence of renal collecting systems bilaterally is likely
physiologic. No stone or focal obstruction is demonstrated.
4. Bladder wall thickening may indicate cystitis.
5. Colonic diverticulosis without evidence of diverticulitis.
6. Multiple compression deformities in the lumbar spine. L1 and L2
compression may be recent. No change since prior study from earlier
today.
These results were called by telephone at the time of interpretation
on 01/11/2017 at [DATE] to Dr. MENZUR INGIN , who verbally
acknowledged these results.

## 2019-04-16 IMAGING — CT CT HEAD W/O CM
3 series · 15 of 46 positions shown, 18 images · non-contrast
Comparison: 08/20/2011

CLINICAL DATA: Syncope

EXAM:
CT HEAD WITHOUT CONTRAST
TECHNIQUE: Contiguous axial images were obtained from the base of the skull
through the vertex without intravenous contrast.

[Series 2: head wo · axial · 0.43mm/px · z∈[-137,-17]mm · 9 of 29 slices shown, 12 images]
[im 3/29  brain]
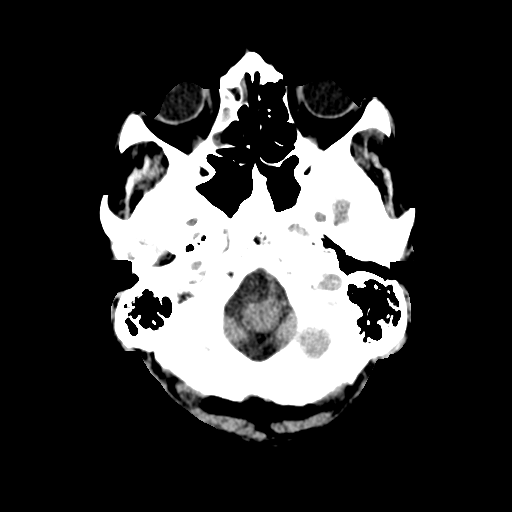
[im 3/29  bone]
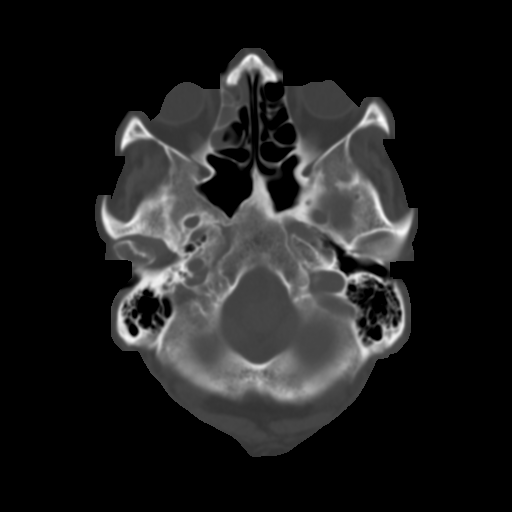
[im 6/29  brain]
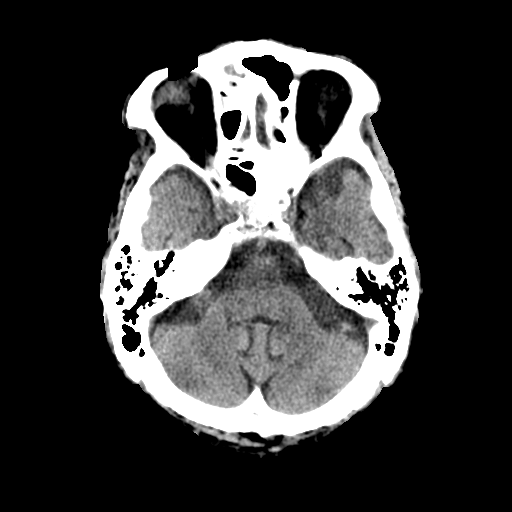
[im 9/29  brain]
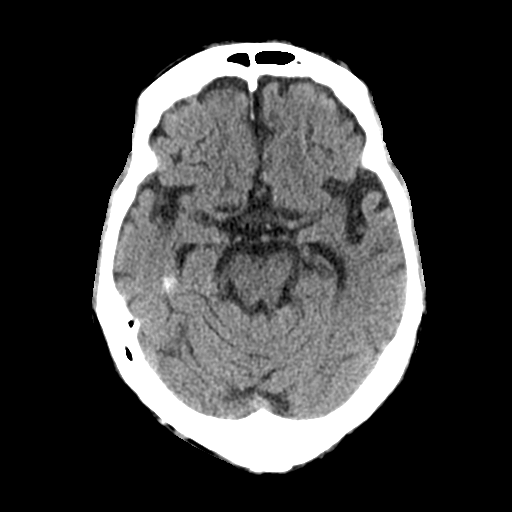
[im 12/29  brain]
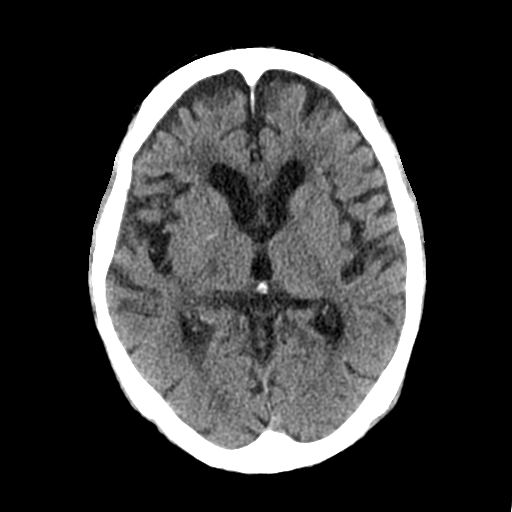
[im 15/29  brain]
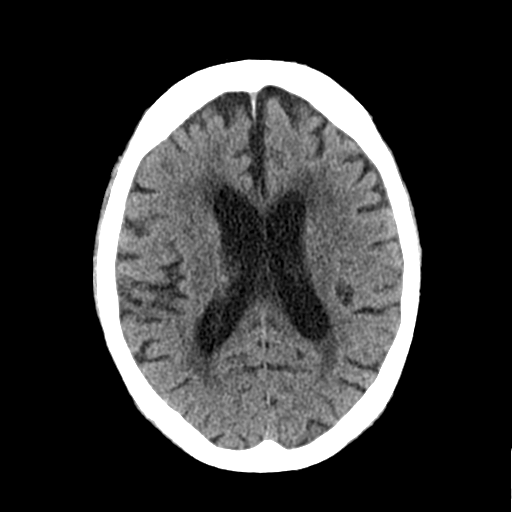
[im 15/29  bone]
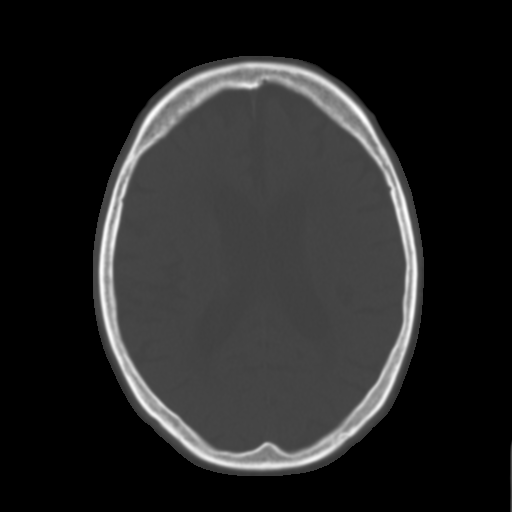
[im 18/29  brain]
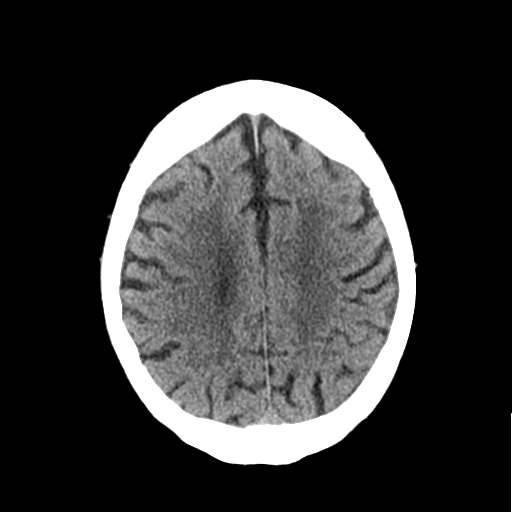
[im 21/29  brain]
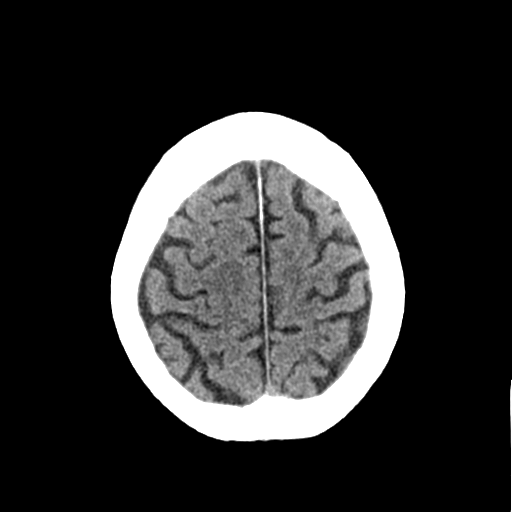
[im 24/29  brain]
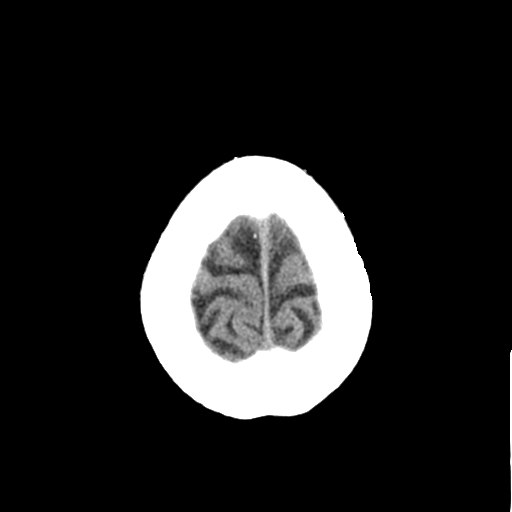
[im 27/29  brain]
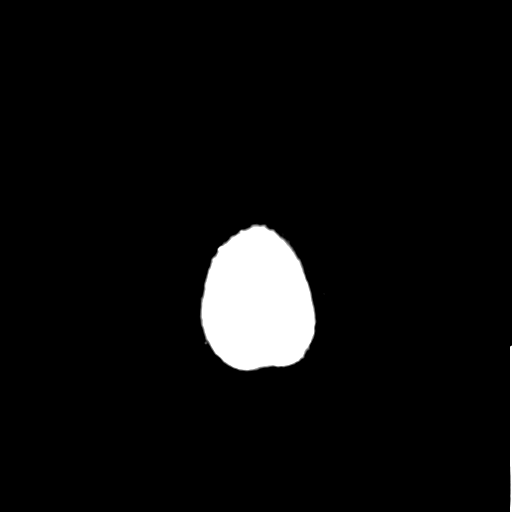
[im 27/29  bone]
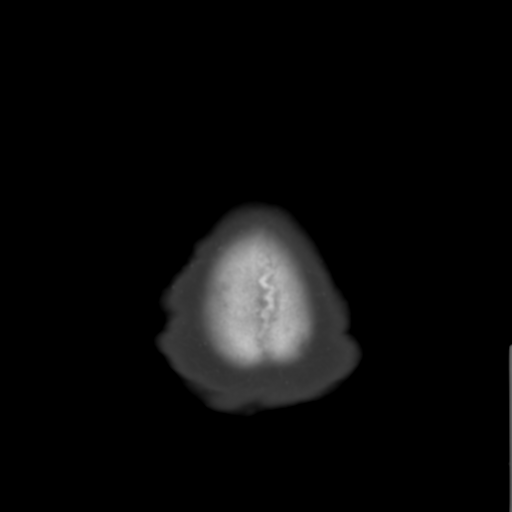

[Series 4: coronal soft tissue · coronal · 0.29mm/px · 3 of 61 slices shown]
[im 21/61  brain]
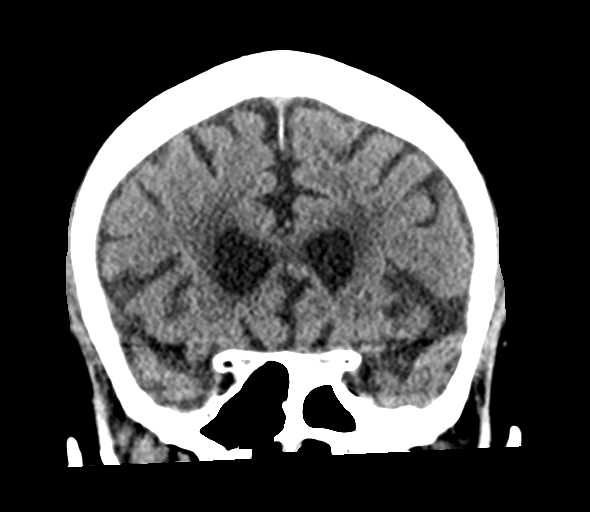
[im 27/61  brain]
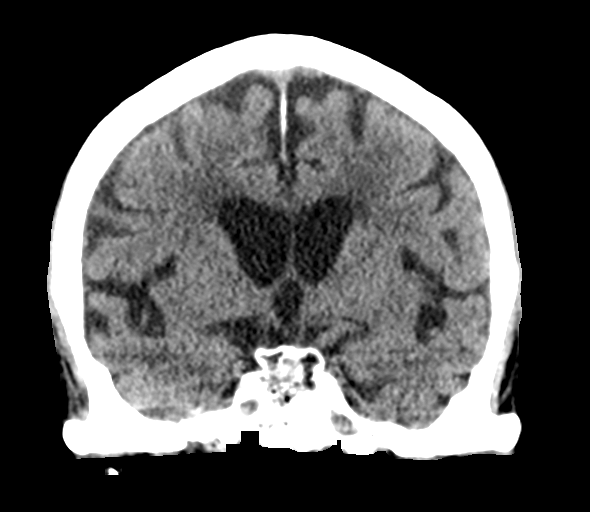
[im 34/61  brain]
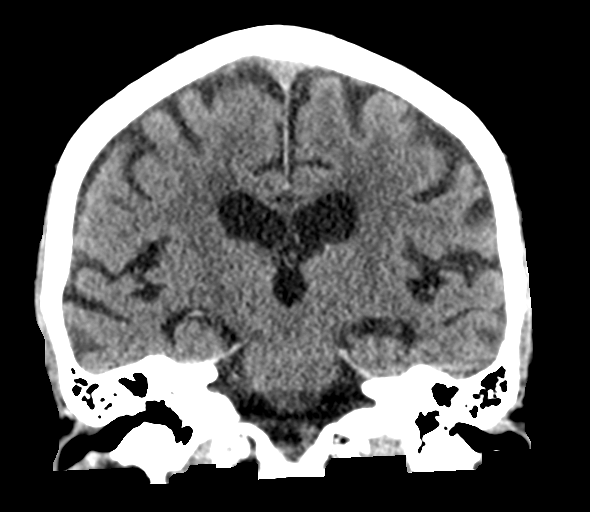

[Series 5: sagittal soft tissue · sagittal · 0.30mm/px · 3 of 50 slices shown]
[im 17/50  brain]
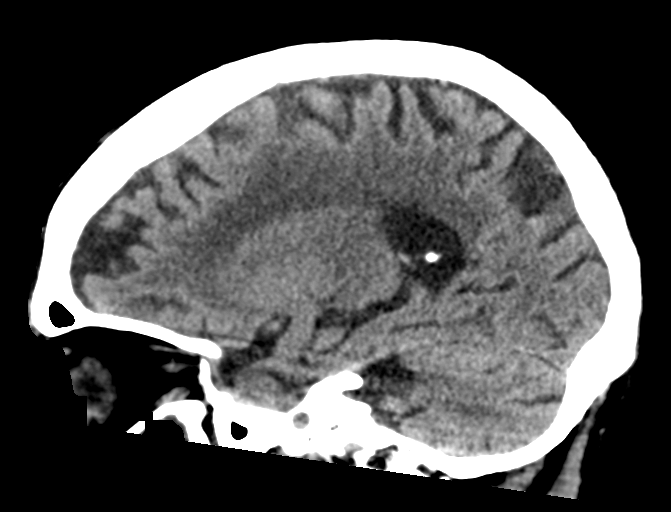
[im 25/50  brain]
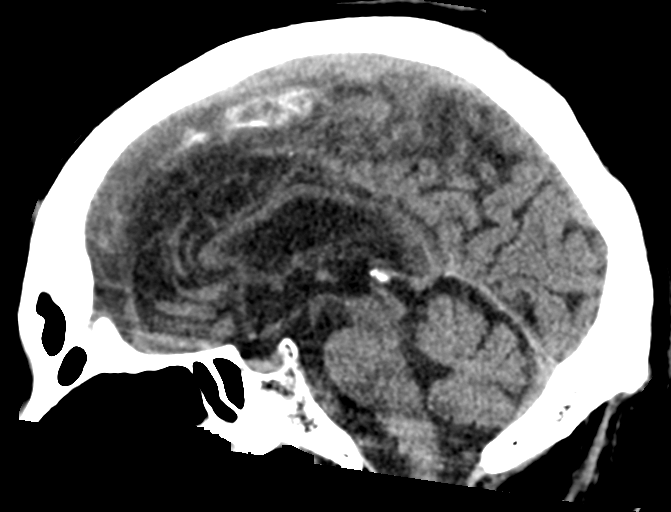
[im 33/50  brain]
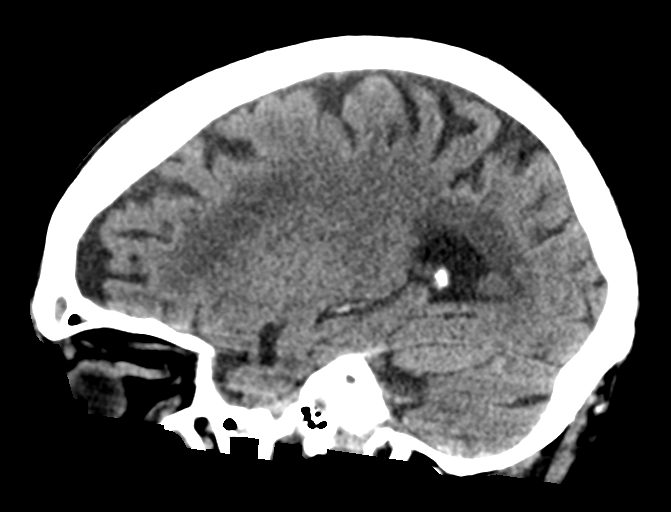

[15 of 46 positions shown; findings below may reference images not displayed]

FINDINGS: Brain: There is no evidence for acute hemorrhage, hydrocephalus,
mass lesion, or abnormal extra-axial fluid collection. No definite
CT evidence for acute infarction. Diffuse loss of parenchymal volume
is consistent with atrophy. Patchy low attenuation in the deep
hemispheric and periventricular white matter is nonspecific, but
likely reflects chronic microvascular ischemic demyelination.

Vascular: No hyperdense vessel or unexpected calcification.

Skull: No evidence for fracture. No worrisome lytic or sclerotic
lesion.

Sinuses/Orbits: Chronic mucosal disease noted in the maxillary
sinuses, right more ethmoid air cells, and right frontal sinus.
Visualized portions of the globes and intraorbital fat are
unremarkable.

Other: None.
IMPRESSION: 1. No acute intracranial abnormality.
2. Atrophy with chronic small vessel white matter ischemic disease.
3. Chronic paranasal sinus disease.

## 2019-10-01 IMAGING — CR DG LUMBAR SPINE 2-3V
1 series · 3 of 3 positions shown · non-contrast
Comparison: August 18, 2014

CLINICAL DATA: Chronic lumbago

EXAM:
LUMBAR SPINE - 2-3 VIEW

[Series 1: dg lumbar spine 2-3 views · 0.14mm/px · 3 of 3 slices shown]
[im 1/3]
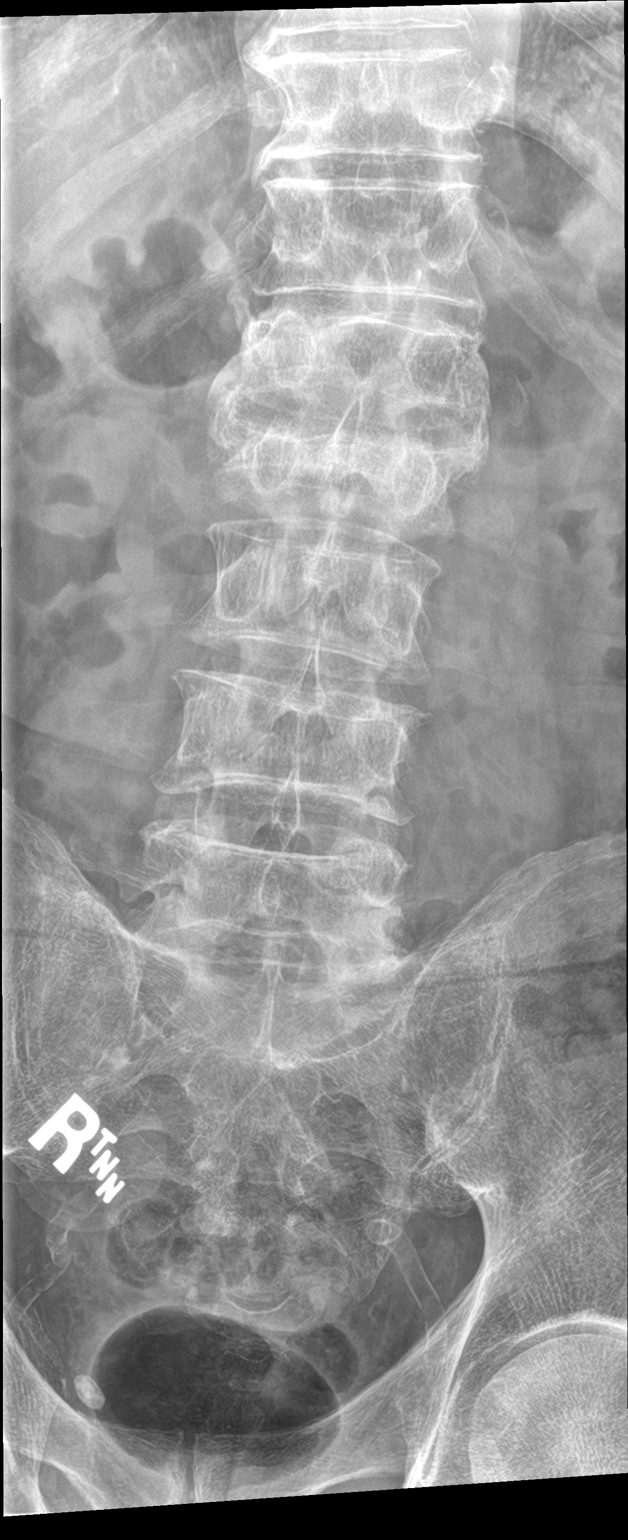
[im 2/3]
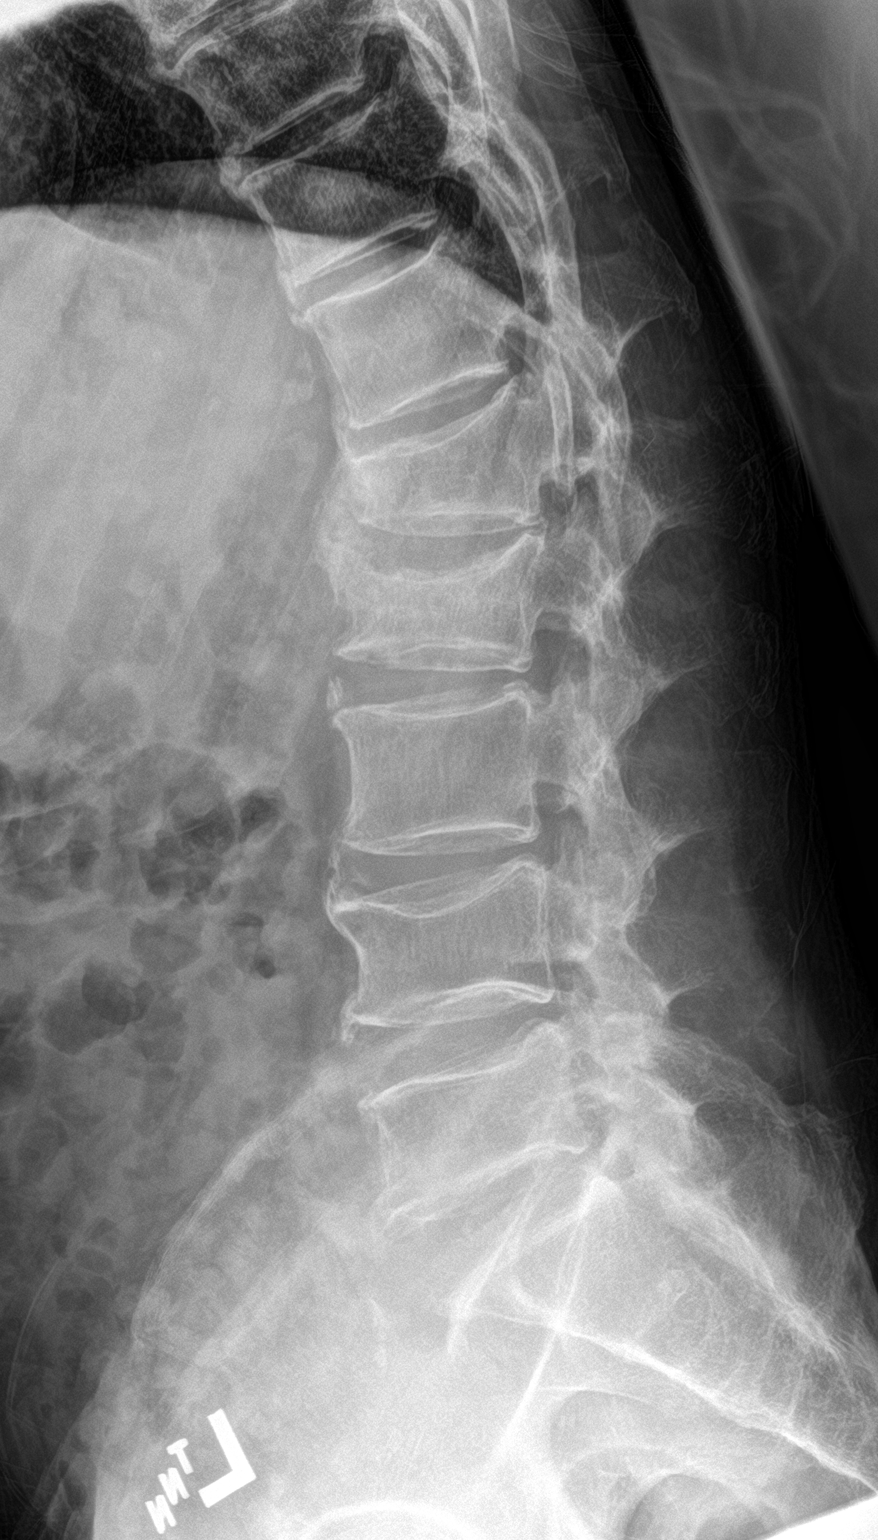
[im 3/3]
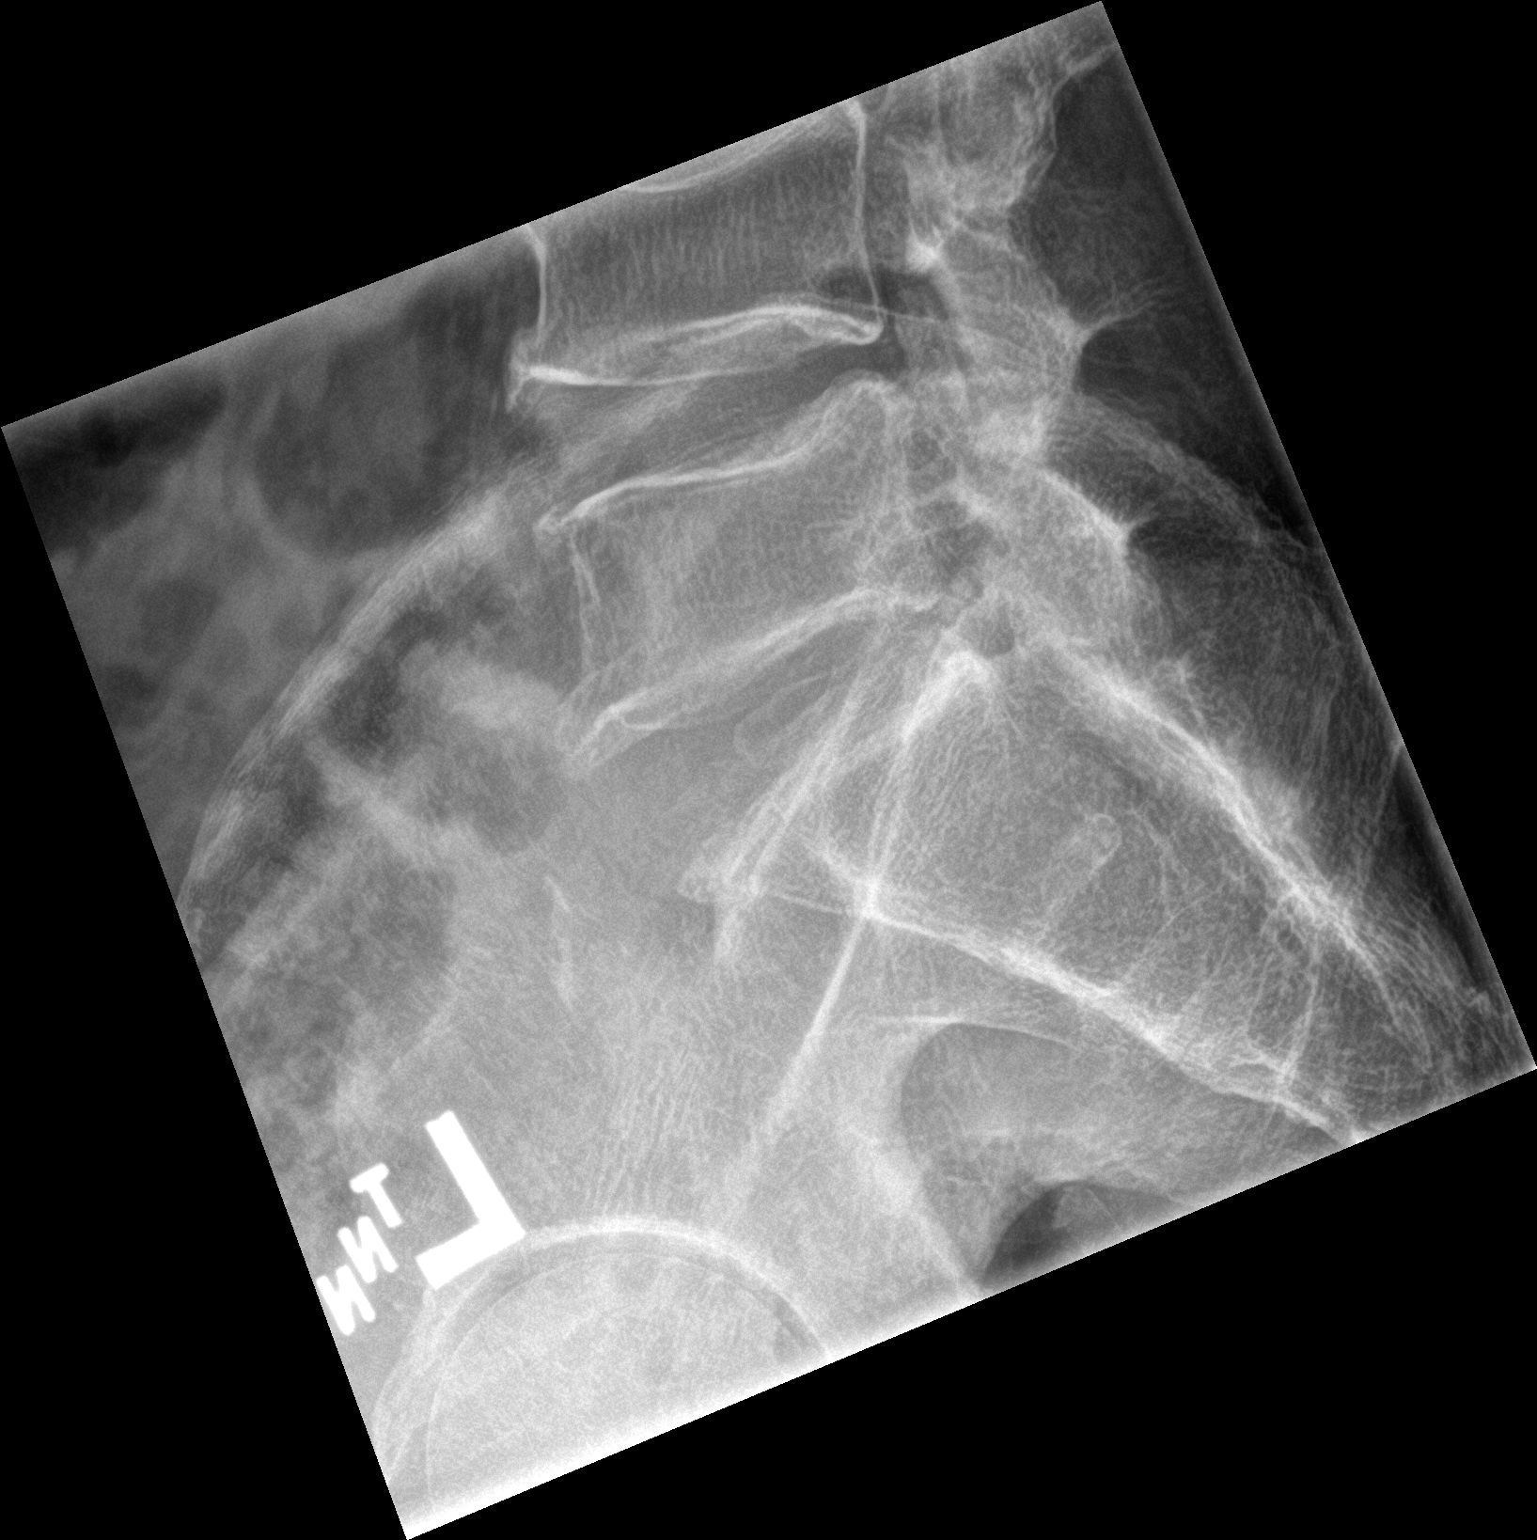

[3 of 3 positions shown; findings below may reference images not displayed]

FINDINGS: Frontal, lateral, and spot lumbosacral lateral images were obtained.
There are 5 non-rib-bearing lumbar type vertebral bodies. There is
moderately severe anterior wedging of the L1 and L2 vertebral
bodies, progressed since 6663. There is milder anterior wedging at
L4 and L5. The anterior wedge fractures at L4 and L5 are chronic.
There is no spondylolisthesis. The disc spaces appear unremarkable.
There are anterior osteophytes at all levels.
IMPRESSION: Progression of anterior wedging at L1 and L2 compared to prior study
from 6663. Note that there is mild kyphosis at L1-2 currently, a
finding not present on prior study. Milder wedging at L4 and L5 is
stable compared to previous study. No spondylolisthesis. No
appreciable disc space narrowing.

## 2022-09-03 DIAGNOSIS — R300 Dysuria: Secondary | ICD-10-CM | POA: Insufficient documentation

## 2022-09-03 DIAGNOSIS — M545 Low back pain, unspecified: Secondary | ICD-10-CM | POA: Insufficient documentation

## 2022-09-03 DIAGNOSIS — G8929 Other chronic pain: Secondary | ICD-10-CM | POA: Insufficient documentation

## 2022-09-04 ENCOUNTER — Encounter: Payer: Self-pay | Admitting: Emergency Medicine

## 2022-09-04 ENCOUNTER — Other Ambulatory Visit: Payer: Self-pay

## 2022-09-04 ENCOUNTER — Emergency Department
Admission: EM | Admit: 2022-09-04 | Discharge: 2022-09-04 | Disposition: A | Discharge status: Home / Self Care | Payer: 59 | Attending: Emergency Medicine | Admitting: Emergency Medicine

## 2022-09-04 DIAGNOSIS — G8929 Other chronic pain: Secondary | ICD-10-CM

## 2022-09-04 DIAGNOSIS — M545 Low back pain, unspecified: Secondary | ICD-10-CM | POA: Diagnosis not present

## 2022-09-04 LAB — CBC WITH DIFFERENTIAL/PLATELET
Abs Immature Granulocytes: 0.01 10*3/uL (ref 0.00–0.07)
Basophils Absolute: 0 10*3/uL (ref 0.0–0.1)
Basophils Relative: 0 %
Eosinophils Absolute: 0 10*3/uL (ref 0.0–0.5)
Eosinophils Relative: 0 %
HCT: 35.8 % — ABNORMAL LOW (ref 39.0–52.0)
Hemoglobin: 11.9 g/dL — ABNORMAL LOW (ref 13.0–17.0)
Immature Granulocytes: 0 %
Lymphocytes Relative: 51 %
Lymphs Abs: 2.7 10*3/uL (ref 0.7–4.0)
MCH: 29.3 pg (ref 26.0–34.0)
MCHC: 33.2 g/dL (ref 30.0–36.0)
MCV: 88.2 fL (ref 80.0–100.0)
Monocytes Absolute: 0.4 10*3/uL (ref 0.1–1.0)
Monocytes Relative: 7 %
Neutro Abs: 2.3 10*3/uL (ref 1.7–7.7)
Neutrophils Relative %: 42 %
Platelets: 100 10*3/uL — ABNORMAL LOW (ref 150–400)
RBC: 4.06 MIL/uL — ABNORMAL LOW (ref 4.22–5.81)
RDW: 14.6 % (ref 11.5–15.5)
WBC: 5.4 10*3/uL (ref 4.0–10.5)
nRBC: 0 % (ref 0.0–0.2)

## 2022-09-04 LAB — URINALYSIS, ROUTINE W REFLEX MICROSCOPIC
Bilirubin Urine: NEGATIVE
Glucose, UA: NEGATIVE mg/dL
Hgb urine dipstick: NEGATIVE
Ketones, ur: NEGATIVE mg/dL
Leukocytes,Ua: NEGATIVE
Nitrite: NEGATIVE
Protein, ur: NEGATIVE mg/dL
Specific Gravity, Urine: 1.016 (ref 1.005–1.030)
pH: 5 (ref 5.0–8.0)

## 2022-09-04 LAB — COMPREHENSIVE METABOLIC PANEL
ALT: 16 U/L (ref 0–44)
AST: 37 U/L (ref 15–41)
Albumin: 3.5 g/dL (ref 3.5–5.0)
Alkaline Phosphatase: 46 U/L (ref 38–126)
Anion gap: 11 (ref 5–15)
BUN: 24 mg/dL — ABNORMAL HIGH (ref 8–23)
CO2: 25 mmol/L (ref 22–32)
Calcium: 8.7 mg/dL — ABNORMAL LOW (ref 8.9–10.3)
Chloride: 102 mmol/L (ref 98–111)
Creatinine, Ser: 1.1 mg/dL (ref 0.61–1.24)
GFR, Estimated: >60 mL/min (ref >60)
Glucose, Bld: 81 mg/dL (ref 70–99)
Potassium: 3.5 mmol/L (ref 3.5–5.1)
Sodium: 138 mmol/L (ref 135–145)
Total Bilirubin: 1.1 mg/dL (ref 0.3–1.2)
Total Protein: 6.7 g/dL (ref 6.5–8.1)

## 2022-09-04 MED ORDER — ACETAMINOPHEN 500 MG PO TABS
1000.0000 mg | ORAL_TABLET | Freq: Once | ORAL | Status: AC
  Administered 2022-09-04: 1000 mg via ORAL
  Filled 2022-09-04: qty 2

## 2022-09-04 MED ORDER — LIDOCAINE 5 % EX PTCH
1.0000 | MEDICATED_PATCH | CUTANEOUS | Status: DC
  Administered 2022-09-04: 1 via TRANSDERMAL
  Filled 2022-09-04: qty 1

## 2022-09-04 NOTE — ED Triage Notes (Signed)
Pt presents ambulatory to triage via POV with complaints of lower back pain and dysuria. He states the back pain is chronic. The dysuria sx started ~ 1 week ago. When he is able to void its dark in color. A&Ox4 at this time. Denies CP or SOB.

## 2022-09-04 NOTE — ED Provider Notes (Signed)
Jesus Hardy Provider Note    Event Date/Time   First MD Initiated Contact with Patient 09/04/22 0133     (approximate)   History   Back Pain   HPI  Jesus Hardy is a 82 y.o. male who presents to the ED for evaluation of Back Pain   Patient presents with chronic low back pain for matter of years.  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  Does not have a PCP, lives with his sister who he presents with tonight.  No fevers, saddle anesthesias or changes to bowel bladder function.  Does not use any medications at home.  Asking for something to help with his back.  Of note, he was triaged as having dysuria.  He denies any pain or urethral discharge but reports sometimes his urine is lighter in color and sometimes is darker in color.   Physical Exam   Triage Vital Signs: ED Triage Vitals  Encounter Vitals Group     BP 09/04/22 0014 (!) 137/91     Systolic BP Percentile --      Diastolic BP Percentile --      Pulse Rate 09/04/22 0014 75     Resp 09/04/22 0014 18     Temp 09/04/22 0014 97.9 F (36.6 C)     Temp Source 09/04/22 0014 Oral     SpO2 09/04/22 0014 100 %     Weight 09/04/22 0010 116 lb 13.5 oz (53 kg)     Height 09/04/22 0010 5\' 8"  (1.727 m)     Head Circumference --      Peak Flow --      Pain Score 09/04/22 0009 10     Pain Loc --      Pain Education --      Exclude from Growth Chart --     Most recent vital signs: Vitals:   09/04/22 0245 09/04/22 0303  BP:  (!) 163/90  Pulse: 71 88  Resp:  20  Temp:    SpO2: 99% 100%    General: Awake, no distress.  Able to stand from his wheelchair and ambulate, apparently at baseline per the sister in the room CV:  Good peripheral perfusion.  Resp:  Normal effort.  Abd:  No distention.  Soft and benign throughout MSK:  No deformity noted.  No spinal tenderness but has some mild paraspinal lumbar tenderness without overlying skin  changes Neuro:  No focal deficits appreciated. Other:     ED Results / Procedures / Treatments   Labs (all labs ordered are listed, but only abnormal results are displayed) Labs Reviewed  CBC WITH DIFFERENTIAL/PLATELET - Abnormal; Notable for the following components:      Result Value   RBC 4.06 (*)    Hemoglobin 11.9 (*)    HCT 35.8 (*)    Platelets 100 (*)    All other components within normal limits  COMPREHENSIVE METABOLIC PANEL - Abnormal; Notable for the following components:   BUN 24 (*)    Calcium 8.7 (*)    All other components within normal limits  URINALYSIS, ROUTINE W REFLEX MICROSCOPIC - Abnormal; Notable for the following components:   Color, Urine YELLOW (*)    APPearance CLEAR (*)    All other components within normal limits    EKG   RADIOLOGY   Official radiology report(s): No results found.  PROCEDURES and INTERVENTIONS:  Procedures  Medications  lidocaine (LIDODERM) 5 %  1 patch (1 patch Transdermal Patch Applied 09/04/22 0303)  acetaminophen (TYLENOL) tablet 1,000 mg (1,000 mg Oral Given 09/04/22 0303)     IMPRESSION / MDM / ASSESSMENT AND PLAN / ED COURSE  I reviewed the triage vital signs and the nursing notes.  Differential diagnosis includes, but is not limited to, cauda equina syndrome, lumbar strain, lumbar compression fracture, UTI, ureteral colic  {Patient presents with symptoms of an acute illness or injury that is potentially life-threatening.  Patient presents with chronic atraumatic low back pain suitable for outpatient management with new PCP referral.  No red flag features or indications for imaging at this point.  Reassuring exam and basic labs within normal metabolic panel and reassuring CBC.  Urine is completely clear so I doubt ureteral colic or UTI.  Will provide Tylenol, lidocaine patch, discharged with the same and referred to a new PCP.  Discussed return precautions appropriate for the ED     FINAL CLINICAL IMPRESSION(S)  / ED DIAGNOSES   Final diagnoses:  Chronic midline low back pain without sciatica     Rx / DC Orders   ED Discharge Orders          Ordered    Ambulatory Referral to Primary Care (Establish Care)       Comments: Chronic back pain   09/04/22 0259             Note:  This document was prepared using Dragon voice recognition software and may include unintentional dictation errors.   Delton Prairie, MD 09/04/22 [phone]

## 2022-09-04 NOTE — Discharge Instructions (Signed)
Use Tylenol for pain and fevers.  Up to 1000 mg per dose, up to 4 times per day.  Do not take more than 4000 mg of Tylenol/acetaminophen within 24 hours.. ° °Please use lidocaine patches at your site of pain.  Apply 1 patch at a time, leave on for 12 hours, then remove for 12 hours.  12 hours on, 12 hours off.  Do not apply more than 1 patch at a time. ° °

## 2023-01-12 ENCOUNTER — Other Ambulatory Visit: Payer: Self-pay

## 2023-01-12 ENCOUNTER — Emergency Department: Payer: 59

## 2023-01-12 ENCOUNTER — Emergency Department
Admission: EM | Admit: 2023-01-12 | Discharge: 2023-01-12 | Disposition: A | Discharge status: Other Acute Inpatient Hospital | Payer: 59 | Attending: Emergency Medicine | Admitting: Emergency Medicine

## 2023-01-12 DIAGNOSIS — I2510 Atherosclerotic heart disease of native coronary artery without angina pectoris: Secondary | ICD-10-CM | POA: Insufficient documentation

## 2023-01-12 DIAGNOSIS — S0990XA Unspecified injury of head, initial encounter: Secondary | ICD-10-CM | POA: Diagnosis not present

## 2023-01-12 DIAGNOSIS — R600 Localized edema: Secondary | ICD-10-CM | POA: Insufficient documentation

## 2023-01-12 DIAGNOSIS — X19XXXA Contact with other heat and hot substances, initial encounter: Secondary | ICD-10-CM | POA: Diagnosis not present

## 2023-01-12 DIAGNOSIS — T24301A Burn of third degree of unspecified site of right lower limb, except ankle and foot, initial encounter: Secondary | ICD-10-CM | POA: Insufficient documentation

## 2023-01-12 DIAGNOSIS — I700 Atherosclerosis of aorta: Secondary | ICD-10-CM | POA: Diagnosis not present

## 2023-01-12 DIAGNOSIS — T310 Burns involving less than 10% of body surface: Secondary | ICD-10-CM | POA: Insufficient documentation

## 2023-01-12 DIAGNOSIS — W19XXXA Unspecified fall, initial encounter: Secondary | ICD-10-CM | POA: Insufficient documentation

## 2023-01-12 DIAGNOSIS — R9082 White matter disease, unspecified: Secondary | ICD-10-CM | POA: Insufficient documentation

## 2023-01-12 LAB — HEPATIC FUNCTION PANEL
ALT: 12 U/L (ref 0–44)
AST: 21 U/L (ref 15–41)
Albumin: 3.2 g/dL — ABNORMAL LOW (ref 3.5–5.0)
Alkaline Phosphatase: 45 U/L (ref 38–126)
Bilirubin, Direct: 0.2 mg/dL (ref 0.0–0.2)
Indirect Bilirubin: 0.6 mg/dL (ref 0.3–0.9)
Total Bilirubin: 0.8 mg/dL (ref 0.0–1.2)
Total Protein: 6.2 g/dL — ABNORMAL LOW (ref 6.5–8.1)

## 2023-01-12 LAB — CBC
HCT: 39 % (ref 39.0–52.0)
Hemoglobin: 13.1 g/dL (ref 13.0–17.0)
MCH: 30 pg (ref 26.0–34.0)
MCHC: 33.6 g/dL (ref 30.0–36.0)
MCV: 89.2 fL (ref 80.0–100.0)
Platelets: 203 10*3/uL (ref 150–400)
RBC: 4.37 MIL/uL (ref 4.22–5.81)
RDW: 13.5 % (ref 11.5–15.5)
WBC: 8.6 10*3/uL (ref 4.0–10.5)
nRBC: 0 % (ref 0.0–0.2)

## 2023-01-12 LAB — BASIC METABOLIC PANEL
Anion gap: 11 (ref 5–15)
BUN: 19 mg/dL (ref 8–23)
CO2: 25 mmol/L (ref 22–32)
Calcium: 8.9 mg/dL (ref 8.9–10.3)
Chloride: 105 mmol/L (ref 98–111)
Creatinine, Ser: 1.17 mg/dL (ref 0.61–1.24)
GFR, Estimated: >60 mL/min (ref >60)
Glucose, Bld: 159 mg/dL — ABNORMAL HIGH (ref 70–99)
Potassium: 3.6 mmol/L (ref 3.5–5.1)
Sodium: 141 mmol/L (ref 135–145)

## 2023-01-12 LAB — LACTIC ACID, PLASMA: Lactic Acid, Venous: 1.6 mmol/L (ref 0.5–1.9)

## 2023-01-12 LAB — SEDIMENTATION RATE: Sed Rate: 17 mm/h (ref 0–20)

## 2023-01-12 LAB — C-REACTIVE PROTEIN: CRP: 1 mg/dL — ABNORMAL HIGH (ref <1.0)

## 2023-01-12 MED ORDER — IOHEXOL 350 MG/ML SOLN
75.0000 mL | Freq: Once | INTRAVENOUS | Status: AC | PRN
  Administered 2023-01-12: 75 mL via INTRAVENOUS

## 2023-01-12 MED ORDER — ONDANSETRON HCL 4 MG/2ML IJ SOLN
4.0000 mg | Freq: Once | INTRAMUSCULAR | Status: AC
  Administered 2023-01-12: 4 mg via INTRAVENOUS
  Filled 2023-01-12: qty 2

## 2023-01-12 MED ORDER — MORPHINE SULFATE (PF) 4 MG/ML IV SOLN
4.0000 mg | Freq: Once | INTRAVENOUS | Status: AC
  Administered 2023-01-12: 4 mg via INTRAVENOUS
  Filled 2023-01-12: qty 1
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
## 2023-01-12 NOTE — ED Notes (Signed)
 Ultrasound at bedside

## 2023-01-12 NOTE — ED Triage Notes (Signed)
 Patient states "my skin is falling off"; patient has blisters of different stages to bilateral thighs.

## 2023-01-12 NOTE — ED Notes (Signed)
 Keflix wrap reapplied to replace dressing removed by ultrasound.

## 2023-02-14 DIAGNOSIS — M7981 Nontraumatic hematoma of soft tissue: Secondary | ICD-10-CM | POA: Diagnosis not present

## 2023-02-14 DIAGNOSIS — R410 Disorientation, unspecified: Secondary | ICD-10-CM | POA: Diagnosis not present

## 2023-02-14 DIAGNOSIS — J189 Pneumonia, unspecified organism: Secondary | ICD-10-CM | POA: Diagnosis not present

## 2023-02-14 DIAGNOSIS — J9601 Acute respiratory failure with hypoxia: Secondary | ICD-10-CM | POA: Diagnosis not present

## 2023-02-14 DIAGNOSIS — I4811 Longstanding persistent atrial fibrillation: Secondary | ICD-10-CM

## 2023-02-14 DIAGNOSIS — J9621 Acute and chronic respiratory failure with hypoxia: Secondary | ICD-10-CM | POA: Diagnosis not present

## 2023-02-14 DIAGNOSIS — Z930 Tracheostomy status: Secondary | ICD-10-CM | POA: Diagnosis not present

## 2023-02-14 DIAGNOSIS — I5022 Chronic systolic (congestive) heart failure: Secondary | ICD-10-CM | POA: Diagnosis not present

## 2023-02-15 DIAGNOSIS — I5022 Chronic systolic (congestive) heart failure: Secondary | ICD-10-CM | POA: Diagnosis not present

## 2023-02-15 DIAGNOSIS — R410 Disorientation, unspecified: Secondary | ICD-10-CM | POA: Diagnosis not present

## 2023-02-15 DIAGNOSIS — M7981 Nontraumatic hematoma of soft tissue: Secondary | ICD-10-CM | POA: Diagnosis not present

## 2023-02-15 DIAGNOSIS — J9601 Acute respiratory failure with hypoxia: Secondary | ICD-10-CM | POA: Diagnosis not present

## 2023-02-16 DIAGNOSIS — I5022 Chronic systolic (congestive) heart failure: Secondary | ICD-10-CM | POA: Diagnosis not present

## 2023-02-16 DIAGNOSIS — R410 Disorientation, unspecified: Secondary | ICD-10-CM | POA: Diagnosis not present

## 2023-02-16 DIAGNOSIS — J9601 Acute respiratory failure with hypoxia: Secondary | ICD-10-CM | POA: Diagnosis not present

## 2023-02-16 DIAGNOSIS — M7981 Nontraumatic hematoma of soft tissue: Secondary | ICD-10-CM | POA: Diagnosis not present

## 2023-02-16 DIAGNOSIS — I4811 Longstanding persistent atrial fibrillation: Secondary | ICD-10-CM

## 2023-02-17 DIAGNOSIS — I5022 Chronic systolic (congestive) heart failure: Secondary | ICD-10-CM | POA: Diagnosis not present

## 2023-02-17 DIAGNOSIS — R410 Disorientation, unspecified: Secondary | ICD-10-CM | POA: Diagnosis not present

## 2023-02-17 DIAGNOSIS — M7981 Nontraumatic hematoma of soft tissue: Secondary | ICD-10-CM | POA: Diagnosis not present

## 2023-02-17 DIAGNOSIS — J9601 Acute respiratory failure with hypoxia: Secondary | ICD-10-CM | POA: Diagnosis not present

## 2023-02-18 DIAGNOSIS — I5022 Chronic systolic (congestive) heart failure: Secondary | ICD-10-CM | POA: Diagnosis not present

## 2023-02-18 DIAGNOSIS — R410 Disorientation, unspecified: Secondary | ICD-10-CM | POA: Diagnosis not present

## 2023-02-18 DIAGNOSIS — J9601 Acute respiratory failure with hypoxia: Secondary | ICD-10-CM | POA: Diagnosis not present

## 2023-02-18 DIAGNOSIS — M7981 Nontraumatic hematoma of soft tissue: Secondary | ICD-10-CM | POA: Diagnosis not present

## 2023-02-19 DIAGNOSIS — I5022 Chronic systolic (congestive) heart failure: Secondary | ICD-10-CM | POA: Diagnosis not present

## 2023-02-19 DIAGNOSIS — J9601 Acute respiratory failure with hypoxia: Secondary | ICD-10-CM | POA: Diagnosis not present

## 2023-02-19 DIAGNOSIS — M7981 Nontraumatic hematoma of soft tissue: Secondary | ICD-10-CM | POA: Diagnosis not present

## 2023-02-19 DIAGNOSIS — R410 Disorientation, unspecified: Secondary | ICD-10-CM | POA: Diagnosis not present

## 2023-02-19 DIAGNOSIS — I4811 Longstanding persistent atrial fibrillation: Secondary | ICD-10-CM

## 2023-02-20 DIAGNOSIS — R410 Disorientation, unspecified: Secondary | ICD-10-CM | POA: Diagnosis not present

## 2023-02-20 DIAGNOSIS — R001 Bradycardia, unspecified: Secondary | ICD-10-CM | POA: Diagnosis not present

## 2023-02-20 DIAGNOSIS — I5022 Chronic systolic (congestive) heart failure: Secondary | ICD-10-CM | POA: Diagnosis not present

## 2023-02-20 DIAGNOSIS — J9601 Acute respiratory failure with hypoxia: Secondary | ICD-10-CM | POA: Diagnosis not present

## 2023-02-20 DIAGNOSIS — M7981 Nontraumatic hematoma of soft tissue: Secondary | ICD-10-CM | POA: Diagnosis not present

## 2023-02-21 DIAGNOSIS — R410 Disorientation, unspecified: Secondary | ICD-10-CM | POA: Diagnosis not present

## 2023-02-21 DIAGNOSIS — I5022 Chronic systolic (congestive) heart failure: Secondary | ICD-10-CM | POA: Diagnosis not present

## 2023-02-21 DIAGNOSIS — M7981 Nontraumatic hematoma of soft tissue: Secondary | ICD-10-CM | POA: Diagnosis not present

## 2023-02-21 DIAGNOSIS — J9601 Acute respiratory failure with hypoxia: Secondary | ICD-10-CM | POA: Diagnosis not present

## 2023-02-22 DIAGNOSIS — I5022 Chronic systolic (congestive) heart failure: Secondary | ICD-10-CM | POA: Diagnosis not present

## 2023-02-22 DIAGNOSIS — J9601 Acute respiratory failure with hypoxia: Secondary | ICD-10-CM | POA: Diagnosis not present

## 2023-02-22 DIAGNOSIS — M7981 Nontraumatic hematoma of soft tissue: Secondary | ICD-10-CM | POA: Diagnosis not present

## 2023-02-22 DIAGNOSIS — R410 Disorientation, unspecified: Secondary | ICD-10-CM | POA: Diagnosis not present

## 2023-02-23 DIAGNOSIS — I5022 Chronic systolic (congestive) heart failure: Secondary | ICD-10-CM | POA: Diagnosis not present

## 2023-02-23 DIAGNOSIS — J9601 Acute respiratory failure with hypoxia: Secondary | ICD-10-CM | POA: Diagnosis not present

## 2023-02-23 DIAGNOSIS — M7981 Nontraumatic hematoma of soft tissue: Secondary | ICD-10-CM | POA: Diagnosis not present

## 2023-02-23 DIAGNOSIS — R410 Disorientation, unspecified: Secondary | ICD-10-CM | POA: Diagnosis not present

## 2023-02-24 DIAGNOSIS — R079 Chest pain, unspecified: Secondary | ICD-10-CM | POA: Diagnosis not present

## 2023-02-24 DIAGNOSIS — M7981 Nontraumatic hematoma of soft tissue: Secondary | ICD-10-CM | POA: Diagnosis not present

## 2023-02-24 DIAGNOSIS — I5022 Chronic systolic (congestive) heart failure: Secondary | ICD-10-CM | POA: Diagnosis not present

## 2023-02-24 DIAGNOSIS — R410 Disorientation, unspecified: Secondary | ICD-10-CM | POA: Diagnosis not present

## 2023-02-24 DIAGNOSIS — J9601 Acute respiratory failure with hypoxia: Secondary | ICD-10-CM | POA: Diagnosis not present

## 2023-02-25 DIAGNOSIS — J9621 Acute and chronic respiratory failure with hypoxia: Secondary | ICD-10-CM | POA: Diagnosis not present

## 2023-02-25 DIAGNOSIS — I5022 Chronic systolic (congestive) heart failure: Secondary | ICD-10-CM | POA: Diagnosis not present

## 2023-02-25 DIAGNOSIS — R410 Disorientation, unspecified: Secondary | ICD-10-CM

## 2023-02-25 DIAGNOSIS — Z930 Tracheostomy status: Secondary | ICD-10-CM | POA: Diagnosis not present

## 2023-02-25 DIAGNOSIS — J9601 Acute respiratory failure with hypoxia: Secondary | ICD-10-CM | POA: Diagnosis not present

## 2023-02-25 DIAGNOSIS — I4811 Longstanding persistent atrial fibrillation: Secondary | ICD-10-CM | POA: Diagnosis not present

## 2023-02-25 DIAGNOSIS — J189 Pneumonia, unspecified organism: Secondary | ICD-10-CM | POA: Diagnosis not present

## 2023-02-25 DIAGNOSIS — M7981 Nontraumatic hematoma of soft tissue: Secondary | ICD-10-CM | POA: Diagnosis not present

## 2023-02-26 DIAGNOSIS — J9601 Acute respiratory failure with hypoxia: Secondary | ICD-10-CM | POA: Diagnosis not present

## 2023-02-26 DIAGNOSIS — I5022 Chronic systolic (congestive) heart failure: Secondary | ICD-10-CM

## 2023-02-26 DIAGNOSIS — Z930 Tracheostomy status: Secondary | ICD-10-CM | POA: Diagnosis not present

## 2023-02-26 DIAGNOSIS — M7981 Nontraumatic hematoma of soft tissue: Secondary | ICD-10-CM | POA: Diagnosis not present

## 2023-02-26 DIAGNOSIS — J189 Pneumonia, unspecified organism: Secondary | ICD-10-CM

## 2023-02-26 DIAGNOSIS — I4811 Longstanding persistent atrial fibrillation: Secondary | ICD-10-CM | POA: Diagnosis not present

## 2023-02-26 DIAGNOSIS — J9621 Acute and chronic respiratory failure with hypoxia: Secondary | ICD-10-CM | POA: Diagnosis not present

## 2023-02-27 DIAGNOSIS — M7981 Nontraumatic hematoma of soft tissue: Secondary | ICD-10-CM | POA: Diagnosis not present

## 2023-02-27 DIAGNOSIS — R410 Disorientation, unspecified: Secondary | ICD-10-CM | POA: Diagnosis not present

## 2023-02-27 DIAGNOSIS — J189 Pneumonia, unspecified organism: Secondary | ICD-10-CM | POA: Diagnosis not present

## 2023-02-27 DIAGNOSIS — J9621 Acute and chronic respiratory failure with hypoxia: Secondary | ICD-10-CM

## 2023-02-27 DIAGNOSIS — J9601 Acute respiratory failure with hypoxia: Secondary | ICD-10-CM | POA: Diagnosis not present

## 2023-02-27 DIAGNOSIS — Z930 Tracheostomy status: Secondary | ICD-10-CM

## 2023-02-27 DIAGNOSIS — I5022 Chronic systolic (congestive) heart failure: Secondary | ICD-10-CM | POA: Diagnosis not present

## 2023-02-27 DIAGNOSIS — I4811 Longstanding persistent atrial fibrillation: Secondary | ICD-10-CM

## 2023-02-28 DIAGNOSIS — M7981 Nontraumatic hematoma of soft tissue: Secondary | ICD-10-CM | POA: Diagnosis not present

## 2023-02-28 DIAGNOSIS — I5022 Chronic systolic (congestive) heart failure: Secondary | ICD-10-CM | POA: Diagnosis not present

## 2023-02-28 DIAGNOSIS — Z930 Tracheostomy status: Secondary | ICD-10-CM | POA: Diagnosis not present

## 2023-02-28 DIAGNOSIS — J189 Pneumonia, unspecified organism: Secondary | ICD-10-CM | POA: Diagnosis not present

## 2023-02-28 DIAGNOSIS — J9601 Acute respiratory failure with hypoxia: Secondary | ICD-10-CM | POA: Diagnosis not present

## 2023-02-28 DIAGNOSIS — I4811 Longstanding persistent atrial fibrillation: Secondary | ICD-10-CM | POA: Diagnosis not present

## 2023-02-28 DIAGNOSIS — J9621 Acute and chronic respiratory failure with hypoxia: Secondary | ICD-10-CM | POA: Diagnosis not present

## 2023-03-01 DIAGNOSIS — J9601 Acute respiratory failure with hypoxia: Secondary | ICD-10-CM | POA: Diagnosis not present

## 2023-03-01 DIAGNOSIS — I5022 Chronic systolic (congestive) heart failure: Secondary | ICD-10-CM | POA: Diagnosis not present

## 2023-03-01 DIAGNOSIS — R410 Disorientation, unspecified: Secondary | ICD-10-CM | POA: Diagnosis not present

## 2023-03-01 DIAGNOSIS — I4811 Longstanding persistent atrial fibrillation: Secondary | ICD-10-CM

## 2023-03-01 DIAGNOSIS — Z930 Tracheostomy status: Secondary | ICD-10-CM | POA: Diagnosis not present

## 2023-03-01 DIAGNOSIS — J189 Pneumonia, unspecified organism: Secondary | ICD-10-CM | POA: Diagnosis not present

## 2023-03-01 DIAGNOSIS — J9621 Acute and chronic respiratory failure with hypoxia: Secondary | ICD-10-CM | POA: Diagnosis not present

## 2023-03-01 DIAGNOSIS — M7981 Nontraumatic hematoma of soft tissue: Secondary | ICD-10-CM | POA: Diagnosis not present

## 2023-03-02 DIAGNOSIS — J9601 Acute respiratory failure with hypoxia: Secondary | ICD-10-CM | POA: Diagnosis not present

## 2023-03-02 DIAGNOSIS — J189 Pneumonia, unspecified organism: Secondary | ICD-10-CM | POA: Diagnosis not present

## 2023-03-02 DIAGNOSIS — Z930 Tracheostomy status: Secondary | ICD-10-CM | POA: Diagnosis not present

## 2023-03-02 DIAGNOSIS — J9621 Acute and chronic respiratory failure with hypoxia: Secondary | ICD-10-CM | POA: Diagnosis not present

## 2023-03-02 DIAGNOSIS — R410 Disorientation, unspecified: Secondary | ICD-10-CM | POA: Diagnosis not present

## 2023-03-02 DIAGNOSIS — I5022 Chronic systolic (congestive) heart failure: Secondary | ICD-10-CM | POA: Diagnosis not present

## 2023-03-02 DIAGNOSIS — M7981 Nontraumatic hematoma of soft tissue: Secondary | ICD-10-CM | POA: Diagnosis not present

## 2023-03-03 DIAGNOSIS — R410 Disorientation, unspecified: Secondary | ICD-10-CM | POA: Diagnosis not present

## 2023-03-03 DIAGNOSIS — J9601 Acute respiratory failure with hypoxia: Secondary | ICD-10-CM | POA: Diagnosis not present

## 2023-03-03 DIAGNOSIS — Z930 Tracheostomy status: Secondary | ICD-10-CM | POA: Diagnosis not present

## 2023-03-03 DIAGNOSIS — I5022 Chronic systolic (congestive) heart failure: Secondary | ICD-10-CM | POA: Diagnosis not present

## 2023-03-03 DIAGNOSIS — J9621 Acute and chronic respiratory failure with hypoxia: Secondary | ICD-10-CM | POA: Diagnosis not present

## 2023-03-03 DIAGNOSIS — J189 Pneumonia, unspecified organism: Secondary | ICD-10-CM | POA: Diagnosis not present

## 2023-03-03 DIAGNOSIS — M7981 Nontraumatic hematoma of soft tissue: Secondary | ICD-10-CM | POA: Diagnosis not present

## 2023-03-04 DIAGNOSIS — R410 Disorientation, unspecified: Secondary | ICD-10-CM | POA: Diagnosis not present

## 2023-03-04 DIAGNOSIS — I4811 Longstanding persistent atrial fibrillation: Secondary | ICD-10-CM

## 2023-03-04 DIAGNOSIS — M7981 Nontraumatic hematoma of soft tissue: Secondary | ICD-10-CM | POA: Diagnosis not present

## 2023-03-04 DIAGNOSIS — Z930 Tracheostomy status: Secondary | ICD-10-CM | POA: Diagnosis not present

## 2023-03-04 DIAGNOSIS — J189 Pneumonia, unspecified organism: Secondary | ICD-10-CM | POA: Diagnosis not present

## 2023-03-04 DIAGNOSIS — I5022 Chronic systolic (congestive) heart failure: Secondary | ICD-10-CM | POA: Diagnosis not present

## 2023-03-04 DIAGNOSIS — J9601 Acute respiratory failure with hypoxia: Secondary | ICD-10-CM | POA: Diagnosis not present

## 2023-03-04 DIAGNOSIS — J9621 Acute and chronic respiratory failure with hypoxia: Secondary | ICD-10-CM | POA: Diagnosis not present

## 2023-03-05 DIAGNOSIS — R410 Disorientation, unspecified: Secondary | ICD-10-CM | POA: Diagnosis not present

## 2023-03-05 DIAGNOSIS — M7981 Nontraumatic hematoma of soft tissue: Secondary | ICD-10-CM | POA: Diagnosis not present

## 2023-03-05 DIAGNOSIS — J9601 Acute respiratory failure with hypoxia: Secondary | ICD-10-CM | POA: Diagnosis not present

## 2023-03-05 DIAGNOSIS — J9621 Acute and chronic respiratory failure with hypoxia: Secondary | ICD-10-CM | POA: Diagnosis not present

## 2023-03-05 DIAGNOSIS — J189 Pneumonia, unspecified organism: Secondary | ICD-10-CM | POA: Diagnosis not present

## 2023-03-05 DIAGNOSIS — Z930 Tracheostomy status: Secondary | ICD-10-CM

## 2023-03-05 DIAGNOSIS — I5022 Chronic systolic (congestive) heart failure: Secondary | ICD-10-CM | POA: Diagnosis not present

## 2023-03-06 DIAGNOSIS — J9621 Acute and chronic respiratory failure with hypoxia: Secondary | ICD-10-CM | POA: Diagnosis not present

## 2023-03-06 DIAGNOSIS — J189 Pneumonia, unspecified organism: Secondary | ICD-10-CM | POA: Diagnosis not present

## 2023-03-06 DIAGNOSIS — Z930 Tracheostomy status: Secondary | ICD-10-CM | POA: Diagnosis not present

## 2023-03-06 DIAGNOSIS — R410 Disorientation, unspecified: Secondary | ICD-10-CM | POA: Diagnosis not present

## 2023-03-06 DIAGNOSIS — J9601 Acute respiratory failure with hypoxia: Secondary | ICD-10-CM | POA: Diagnosis not present

## 2023-03-06 DIAGNOSIS — I5022 Chronic systolic (congestive) heart failure: Secondary | ICD-10-CM | POA: Diagnosis not present

## 2023-03-06 DIAGNOSIS — M7981 Nontraumatic hematoma of soft tissue: Secondary | ICD-10-CM | POA: Diagnosis not present

## 2023-03-07 DIAGNOSIS — J9621 Acute and chronic respiratory failure with hypoxia: Secondary | ICD-10-CM | POA: Diagnosis not present

## 2023-03-07 DIAGNOSIS — M7981 Nontraumatic hematoma of soft tissue: Secondary | ICD-10-CM | POA: Diagnosis not present

## 2023-03-07 DIAGNOSIS — I5022 Chronic systolic (congestive) heart failure: Secondary | ICD-10-CM | POA: Diagnosis not present

## 2023-03-07 DIAGNOSIS — Z930 Tracheostomy status: Secondary | ICD-10-CM | POA: Diagnosis not present

## 2023-03-07 DIAGNOSIS — J9601 Acute respiratory failure with hypoxia: Secondary | ICD-10-CM | POA: Diagnosis not present

## 2023-03-07 DIAGNOSIS — J189 Pneumonia, unspecified organism: Secondary | ICD-10-CM | POA: Diagnosis not present

## 2023-03-07 DIAGNOSIS — R410 Disorientation, unspecified: Secondary | ICD-10-CM | POA: Diagnosis not present

## 2023-03-08 DIAGNOSIS — I5022 Chronic systolic (congestive) heart failure: Secondary | ICD-10-CM | POA: Diagnosis not present

## 2023-03-08 DIAGNOSIS — J9601 Acute respiratory failure with hypoxia: Secondary | ICD-10-CM | POA: Diagnosis not present

## 2023-03-08 DIAGNOSIS — M7981 Nontraumatic hematoma of soft tissue: Secondary | ICD-10-CM | POA: Diagnosis not present

## 2023-03-08 DIAGNOSIS — R410 Disorientation, unspecified: Secondary | ICD-10-CM | POA: Diagnosis not present

## 2023-03-09 DIAGNOSIS — R410 Disorientation, unspecified: Secondary | ICD-10-CM | POA: Diagnosis not present

## 2023-03-09 DIAGNOSIS — J9601 Acute respiratory failure with hypoxia: Secondary | ICD-10-CM | POA: Diagnosis not present

## 2023-03-09 DIAGNOSIS — M7981 Nontraumatic hematoma of soft tissue: Secondary | ICD-10-CM | POA: Diagnosis not present

## 2023-03-09 DIAGNOSIS — I5022 Chronic systolic (congestive) heart failure: Secondary | ICD-10-CM | POA: Diagnosis not present

## 2023-03-10 DIAGNOSIS — I5022 Chronic systolic (congestive) heart failure: Secondary | ICD-10-CM | POA: Diagnosis not present

## 2023-03-10 DIAGNOSIS — M7981 Nontraumatic hematoma of soft tissue: Secondary | ICD-10-CM | POA: Diagnosis not present

## 2023-03-10 DIAGNOSIS — J9601 Acute respiratory failure with hypoxia: Secondary | ICD-10-CM | POA: Diagnosis not present

## 2023-03-10 DIAGNOSIS — R410 Disorientation, unspecified: Secondary | ICD-10-CM | POA: Diagnosis not present

## 2023-03-11 DIAGNOSIS — R410 Disorientation, unspecified: Secondary | ICD-10-CM | POA: Diagnosis not present

## 2023-03-11 DIAGNOSIS — M7981 Nontraumatic hematoma of soft tissue: Secondary | ICD-10-CM | POA: Diagnosis not present

## 2023-03-11 DIAGNOSIS — I5022 Chronic systolic (congestive) heart failure: Secondary | ICD-10-CM | POA: Diagnosis not present

## 2023-03-11 DIAGNOSIS — J9601 Acute respiratory failure with hypoxia: Secondary | ICD-10-CM | POA: Diagnosis not present

## 2023-03-12 DIAGNOSIS — M7981 Nontraumatic hematoma of soft tissue: Secondary | ICD-10-CM | POA: Diagnosis not present

## 2023-03-12 DIAGNOSIS — R410 Disorientation, unspecified: Secondary | ICD-10-CM | POA: Diagnosis not present

## 2023-03-12 DIAGNOSIS — I5022 Chronic systolic (congestive) heart failure: Secondary | ICD-10-CM | POA: Diagnosis not present

## 2023-03-12 DIAGNOSIS — J9601 Acute respiratory failure with hypoxia: Secondary | ICD-10-CM | POA: Diagnosis not present

## 2023-03-13 DIAGNOSIS — J9601 Acute respiratory failure with hypoxia: Secondary | ICD-10-CM | POA: Diagnosis not present

## 2023-03-13 DIAGNOSIS — M7981 Nontraumatic hematoma of soft tissue: Secondary | ICD-10-CM | POA: Diagnosis not present

## 2023-03-13 DIAGNOSIS — I5022 Chronic systolic (congestive) heart failure: Secondary | ICD-10-CM | POA: Diagnosis not present

## 2023-03-13 DIAGNOSIS — R410 Disorientation, unspecified: Secondary | ICD-10-CM | POA: Diagnosis not present

## 2023-03-14 DIAGNOSIS — M7981 Nontraumatic hematoma of soft tissue: Secondary | ICD-10-CM | POA: Diagnosis not present

## 2023-03-14 DIAGNOSIS — R410 Disorientation, unspecified: Secondary | ICD-10-CM | POA: Diagnosis not present

## 2023-03-14 DIAGNOSIS — J9601 Acute respiratory failure with hypoxia: Secondary | ICD-10-CM | POA: Diagnosis not present

## 2023-03-14 DIAGNOSIS — I5022 Chronic systolic (congestive) heart failure: Secondary | ICD-10-CM | POA: Diagnosis not present

## 2023-03-15 DIAGNOSIS — M7981 Nontraumatic hematoma of soft tissue: Secondary | ICD-10-CM | POA: Diagnosis not present

## 2023-03-15 DIAGNOSIS — I5022 Chronic systolic (congestive) heart failure: Secondary | ICD-10-CM | POA: Diagnosis not present

## 2023-03-15 DIAGNOSIS — J9601 Acute respiratory failure with hypoxia: Secondary | ICD-10-CM | POA: Diagnosis not present

## 2023-03-15 DIAGNOSIS — R410 Disorientation, unspecified: Secondary | ICD-10-CM | POA: Diagnosis not present

## 2023-03-16 DIAGNOSIS — J9601 Acute respiratory failure with hypoxia: Secondary | ICD-10-CM | POA: Diagnosis not present

## 2023-03-16 DIAGNOSIS — R410 Disorientation, unspecified: Secondary | ICD-10-CM

## 2023-03-16 DIAGNOSIS — I4811 Longstanding persistent atrial fibrillation: Secondary | ICD-10-CM | POA: Diagnosis not present

## 2023-03-16 DIAGNOSIS — M7981 Nontraumatic hematoma of soft tissue: Secondary | ICD-10-CM | POA: Diagnosis not present

## 2023-03-16 DIAGNOSIS — I5022 Chronic systolic (congestive) heart failure: Secondary | ICD-10-CM | POA: Diagnosis not present

## 2023-03-17 DIAGNOSIS — I5022 Chronic systolic (congestive) heart failure: Secondary | ICD-10-CM | POA: Diagnosis not present

## 2023-03-17 DIAGNOSIS — J9601 Acute respiratory failure with hypoxia: Secondary | ICD-10-CM | POA: Diagnosis not present

## 2023-03-17 DIAGNOSIS — I4811 Longstanding persistent atrial fibrillation: Secondary | ICD-10-CM | POA: Diagnosis not present

## 2023-03-17 DIAGNOSIS — M7981 Nontraumatic hematoma of soft tissue: Secondary | ICD-10-CM | POA: Diagnosis not present

## 2023-03-18 DIAGNOSIS — I4811 Longstanding persistent atrial fibrillation: Secondary | ICD-10-CM | POA: Diagnosis not present

## 2023-03-18 DIAGNOSIS — I5022 Chronic systolic (congestive) heart failure: Secondary | ICD-10-CM | POA: Diagnosis not present

## 2023-03-18 DIAGNOSIS — R410 Disorientation, unspecified: Secondary | ICD-10-CM

## 2023-03-18 DIAGNOSIS — J9601 Acute respiratory failure with hypoxia: Secondary | ICD-10-CM | POA: Diagnosis not present

## 2023-03-18 DIAGNOSIS — M7981 Nontraumatic hematoma of soft tissue: Secondary | ICD-10-CM | POA: Diagnosis not present
